# Patient Record
Sex: Male | Born: 1960 | Race: White | Hispanic: No | Marital: Married | Smoking: Former smoker
Health system: Southern US, Community
[De-identification: ages and names within clinical notes are randomized; demographics above are authoritative.]

## PROBLEM LIST (undated history)

## (undated) DIAGNOSIS — E785 Hyperlipidemia, unspecified: Secondary | ICD-10-CM

## (undated) DIAGNOSIS — I2510 Atherosclerotic heart disease of native coronary artery without angina pectoris: Secondary | ICD-10-CM

## (undated) DIAGNOSIS — I209 Angina pectoris, unspecified: Secondary | ICD-10-CM

## (undated) DIAGNOSIS — I509 Heart failure, unspecified: Secondary | ICD-10-CM

## (undated) DIAGNOSIS — I10 Essential (primary) hypertension: Secondary | ICD-10-CM

## (undated) DIAGNOSIS — I219 Acute myocardial infarction, unspecified: Secondary | ICD-10-CM

## (undated) DIAGNOSIS — R51 Headache: Secondary | ICD-10-CM

## (undated) HISTORY — PX: CORONARY ARTERY BYPASS GRAFT: SHX141

## (undated) HISTORY — PX: TOOTH EXTRACTION: SUR596

---

## 2010-06-16 ENCOUNTER — Inpatient Hospital Stay (HOSPITAL_COMMUNITY): Admission: EM | Admit: 2010-06-16 | Discharge: 2010-06-25 | Payer: Self-pay | Admitting: Emergency Medicine

## 2010-06-16 ENCOUNTER — Ambulatory Visit: Payer: Self-pay | Admitting: Internal Medicine

## 2010-06-17 ENCOUNTER — Ambulatory Visit: Payer: Self-pay | Admitting: Cardiothoracic Surgery

## 2010-06-18 ENCOUNTER — Encounter: Payer: Self-pay | Admitting: Internal Medicine

## 2010-06-20 ENCOUNTER — Encounter: Payer: Self-pay | Admitting: Cardiothoracic Surgery

## 2010-06-21 ENCOUNTER — Encounter: Payer: Self-pay | Admitting: Thoracic Surgery (Cardiothoracic Vascular Surgery)

## 2010-07-11 ENCOUNTER — Encounter
Admission: RE | Admit: 2010-07-11 | Discharge: 2010-07-11 | Payer: Self-pay | Admitting: Thoracic Surgery (Cardiothoracic Vascular Surgery)

## 2010-07-11 ENCOUNTER — Ambulatory Visit: Payer: Self-pay | Admitting: Thoracic Surgery (Cardiothoracic Vascular Surgery)

## 2010-07-25 ENCOUNTER — Encounter
Admission: RE | Admit: 2010-07-25 | Discharge: 2010-07-25 | Payer: Self-pay | Admitting: Thoracic Surgery (Cardiothoracic Vascular Surgery)

## 2010-07-25 ENCOUNTER — Ambulatory Visit: Payer: Self-pay | Admitting: Thoracic Surgery (Cardiothoracic Vascular Surgery)

## 2010-08-03 ENCOUNTER — Encounter: Admission: RE | Admit: 2010-08-03 | Discharge: 2010-08-03 | Payer: Self-pay | Admitting: Cardiothoracic Surgery

## 2010-08-03 ENCOUNTER — Ambulatory Visit: Payer: Self-pay | Admitting: Cardiothoracic Surgery

## 2010-09-05 ENCOUNTER — Encounter
Admission: RE | Admit: 2010-09-05 | Discharge: 2010-09-05 | Payer: Self-pay | Admitting: Thoracic Surgery (Cardiothoracic Vascular Surgery)

## 2010-09-05 ENCOUNTER — Ambulatory Visit: Payer: Self-pay | Admitting: Thoracic Surgery (Cardiothoracic Vascular Surgery)

## 2010-10-22 ENCOUNTER — Encounter: Payer: Self-pay | Admitting: Thoracic Surgery (Cardiothoracic Vascular Surgery)

## 2010-12-15 LAB — URINALYSIS, ROUTINE W REFLEX MICROSCOPIC
Hgb urine dipstick: NEGATIVE
Nitrite: NEGATIVE
Protein, ur: NEGATIVE mg/dL
Specific Gravity, Urine: 1.013 (ref 1.005–1.030)
Urobilinogen, UA: 1 mg/dL (ref 0.0–1.0)

## 2010-12-15 LAB — POCT I-STAT 3, ART BLOOD GAS (G3+)
Acid-Base Excess: 2 mmol/L (ref 0.0–2.0)
Acid-base deficit: 3 mmol/L — ABNORMAL HIGH (ref 0.0–2.0)
Acid-base deficit: 5 mmol/L — ABNORMAL HIGH (ref 0.0–2.0)
Bicarbonate: 21.7 mEq/L (ref 20.0–24.0)
Bicarbonate: 22.8 mEq/L (ref 20.0–24.0)
Bicarbonate: 23 mEq/L (ref 20.0–24.0)
O2 Saturation: 100 %
O2 Saturation: 91 %
O2 Saturation: 99 %
Patient temperature: 34.4
TCO2: 23 mmol/L (ref 0–100)
TCO2: 25 mmol/L (ref 0–100)
TCO2: 29 mmol/L (ref 0–100)
pCO2 arterial: 43.5 mmHg (ref 35.0–45.0)
pCO2 arterial: 45.3 mmHg — ABNORMAL HIGH (ref 35.0–45.0)
pCO2 arterial: 47.9 mmHg — ABNORMAL HIGH (ref 35.0–45.0)
pCO2 arterial: 49.5 mmHg — ABNORMAL HIGH (ref 35.0–45.0)
pH, Arterial: 7.274 — ABNORMAL LOW (ref 7.350–7.450)
pO2, Arterial: 291 mmHg — ABNORMAL HIGH (ref 80.0–100.0)
pO2, Arterial: 68 mmHg — ABNORMAL LOW (ref 80.0–100.0)

## 2010-12-15 LAB — POCT I-STAT, CHEM 8
BUN: 16 mg/dL (ref 6–23)
BUN: 9 mg/dL (ref 6–23)
Calcium, Ion: 1.09 mmol/L — ABNORMAL LOW (ref 1.12–1.32)
Chloride: 107 mEq/L (ref 96–112)
Chloride: 107 mEq/L (ref 96–112)
Creatinine, Ser: 0.7 mg/dL (ref 0.4–1.5)
Creatinine, Ser: 0.9 mg/dL (ref 0.4–1.5)
Glucose, Bld: 138 mg/dL — ABNORMAL HIGH (ref 70–99)
Glucose, Bld: 96 mg/dL (ref 70–99)
Potassium: 4.4 mEq/L (ref 3.5–5.1)
Sodium: 139 mEq/L (ref 135–145)
TCO2: 23 mmol/L (ref 0–100)

## 2010-12-15 LAB — POCT I-STAT 4, (NA,K, GLUC, HGB,HCT)
Glucose, Bld: 111 mg/dL — ABNORMAL HIGH (ref 70–99)
Glucose, Bld: 145 mg/dL — ABNORMAL HIGH (ref 70–99)
Glucose, Bld: 81 mg/dL (ref 70–99)
Glucose, Bld: 99 mg/dL (ref 70–99)
HCT: 28 % — ABNORMAL LOW (ref 39.0–52.0)
HCT: 33 % — ABNORMAL LOW (ref 39.0–52.0)
HCT: 33 % — ABNORMAL LOW (ref 39.0–52.0)
HCT: 40 % (ref 39.0–52.0)
Hemoglobin: 11.2 g/dL — ABNORMAL LOW (ref 13.0–17.0)
Hemoglobin: 11.2 g/dL — ABNORMAL LOW (ref 13.0–17.0)
Hemoglobin: 12.9 g/dL — ABNORMAL LOW (ref 13.0–17.0)
Hemoglobin: 9.5 g/dL — ABNORMAL LOW (ref 13.0–17.0)
Potassium: 3.5 mEq/L (ref 3.5–5.1)
Potassium: 4.2 mEq/L (ref 3.5–5.1)
Potassium: 5.7 mEq/L — ABNORMAL HIGH (ref 3.5–5.1)
Potassium: 5.9 mEq/L — ABNORMAL HIGH (ref 3.5–5.1)
Sodium: 131 mEq/L — ABNORMAL LOW (ref 135–145)
Sodium: 138 mEq/L (ref 135–145)
Sodium: 138 mEq/L (ref 135–145)
Sodium: 140 mEq/L (ref 135–145)

## 2010-12-15 LAB — CREATININE, SERUM
Creatinine, Ser: 0.84 mg/dL (ref 0.4–1.5)
Creatinine, Ser: 0.86 mg/dL (ref 0.4–1.5)
GFR calc Af Amer: >60 mL/min (ref >60)
GFR calc Af Amer: >60 mL/min (ref >60)

## 2010-12-15 LAB — CBC
HCT: 33.2 % — ABNORMAL LOW (ref 39.0–52.0)
HCT: 33.8 % — ABNORMAL LOW (ref 39.0–52.0)
HCT: 34.4 % — ABNORMAL LOW (ref 39.0–52.0)
HCT: 43.5 % (ref 39.0–52.0)
HCT: 43.7 % (ref 39.0–52.0)
Hemoglobin: 11.4 g/dL — ABNORMAL LOW (ref 13.0–17.0)
Hemoglobin: 11.7 g/dL — ABNORMAL LOW (ref 13.0–17.0)
Hemoglobin: 15.8 g/dL (ref 13.0–17.0)
Hemoglobin: 15.9 g/dL (ref 13.0–17.0)
MCH: 26.5 pg (ref 26.0–34.0)
MCH: 27 pg (ref 26.0–34.0)
MCH: 27.1 pg (ref 26.0–34.0)
MCH: 27.4 pg (ref 26.0–34.0)
MCH: 27.5 pg (ref 26.0–34.0)
MCH: 27.6 pg (ref 26.0–34.0)
MCH: 27.7 pg (ref 26.0–34.0)
MCH: 27.9 pg (ref 26.0–34.0)
MCH: 28 pg (ref 26.0–34.0)
MCH: 28.1 pg (ref 26.0–34.0)
MCHC: 33.4 g/dL (ref 30.0–36.0)
MCHC: 33.7 g/dL (ref 30.0–36.0)
MCHC: 34.6 g/dL (ref 30.0–36.0)
MCHC: 34.6 g/dL (ref 30.0–36.0)
MCHC: 34.7 g/dL (ref 30.0–36.0)
MCHC: 34.7 g/dL (ref 30.0–36.0)
MCHC: 35.4 g/dL (ref 30.0–36.0)
MCV: 79 fL (ref 78.0–100.0)
MCV: 79.3 fL (ref 78.0–100.0)
MCV: 79.8 fL (ref 78.0–100.0)
MCV: 80 fL (ref 78.0–100.0)
MCV: 82.4 fL (ref 78.0–100.0)
Platelets: 125 10*3/uL — ABNORMAL LOW (ref 150–400)
Platelets: 128 10*3/uL — ABNORMAL LOW (ref 150–400)
Platelets: 185 10*3/uL (ref 150–400)
Platelets: 196 10*3/uL (ref 150–400)
Platelets: 208 10*3/uL (ref 150–400)
RBC: 3.98 MIL/uL — ABNORMAL LOW (ref 4.22–5.81)
RBC: 4.14 MIL/uL — ABNORMAL LOW (ref 4.22–5.81)
RBC: 4.31 MIL/uL (ref 4.22–5.81)
RBC: 5.73 MIL/uL (ref 4.22–5.81)
RDW: 13.5 % (ref 11.5–15.5)
RDW: 13.6 % (ref 11.5–15.5)
RDW: 13.6 % (ref 11.5–15.5)
RDW: 13.6 % (ref 11.5–15.5)
RDW: 13.7 % (ref 11.5–15.5)
RDW: 13.7 % (ref 11.5–15.5)
WBC: 13.2 10*3/uL — ABNORMAL HIGH (ref 4.0–10.5)
WBC: 8.3 10*3/uL (ref 4.0–10.5)

## 2010-12-15 LAB — BASIC METABOLIC PANEL
BUN: 10 mg/dL (ref 6–23)
BUN: 11 mg/dL (ref 6–23)
BUN: 11 mg/dL (ref 6–23)
CO2: 27 mEq/L (ref 19–32)
Calcium: 10.1 mg/dL (ref 8.4–10.5)
Calcium: 9.3 mg/dL (ref 8.4–10.5)
Chloride: 102 mEq/L (ref 96–112)
Chloride: 108 mEq/L (ref 96–112)
Creatinine, Ser: 0.78 mg/dL (ref 0.4–1.5)
Creatinine, Ser: 0.94 mg/dL (ref 0.4–1.5)
Creatinine, Ser: 1.11 mg/dL (ref 0.4–1.5)
GFR calc Af Amer: >60 mL/min (ref >60)
GFR calc Af Amer: >60 mL/min (ref >60)
GFR calc non Af Amer: >60 mL/min (ref >60)
GFR calc non Af Amer: >60 mL/min (ref >60)
GFR calc non Af Amer: >60 mL/min (ref >60)
Glucose, Bld: 92 mg/dL (ref 70–99)
Potassium: 4.3 mEq/L (ref 3.5–5.1)
Sodium: 137 mEq/L (ref 135–145)
Sodium: 138 mEq/L (ref 135–145)
Sodium: 141 mEq/L (ref 135–145)

## 2010-12-15 LAB — HEMOGLOBIN AND HEMATOCRIT, BLOOD
HCT: 32.6 % — ABNORMAL LOW (ref 39.0–52.0)
Hemoglobin: 11.3 g/dL — ABNORMAL LOW (ref 13.0–17.0)

## 2010-12-15 LAB — HEPARIN LEVEL (UNFRACTIONATED)
Heparin Unfractionated: 0.28 IU/mL — ABNORMAL LOW (ref 0.30–0.70)
Heparin Unfractionated: 0.47 IU/mL (ref 0.30–0.70)
Heparin Unfractionated: 0.64 IU/mL (ref 0.30–0.70)
Heparin Unfractionated: 0.64 IU/mL (ref 0.30–0.70)

## 2010-12-15 LAB — BLOOD GAS, ARTERIAL
Acid-Base Excess: 1.1 mmol/L (ref 0.0–2.0)
Bicarbonate: 25.5 mEq/L — ABNORMAL HIGH (ref 20.0–24.0)
O2 Saturation: 96.2 %
Patient temperature: 98.6
pO2, Arterial: 83.2 mmHg (ref 80.0–100.0)

## 2010-12-15 LAB — COMPREHENSIVE METABOLIC PANEL
ALT: 19 U/L (ref 0–53)
AST: 18 U/L (ref 0–37)
CO2: 28 mEq/L (ref 19–32)
Calcium: 9.7 mg/dL (ref 8.4–10.5)
Chloride: 102 mEq/L (ref 96–112)
GFR calc Af Amer: >60 mL/min (ref >60)
GFR calc non Af Amer: >60 mL/min (ref >60)
Sodium: 140 mEq/L (ref 135–145)

## 2010-12-15 LAB — APTT: aPTT: 29 seconds (ref 24–37)

## 2010-12-15 LAB — CARDIAC PANEL(CRET KIN+CKTOT+MB+TROPI)
CK, MB: 4.2 ng/mL — ABNORMAL HIGH (ref 0.3–4.0)
Total CK: 110 U/L (ref 7–232)
Total CK: 123 U/L (ref 7–232)
Troponin I: 0.07 ng/mL — ABNORMAL HIGH (ref 0.00–0.06)

## 2010-12-15 LAB — LIPID PANEL
Triglycerides: 78 mg/dL (ref <150)
VLDL: 16 mg/dL (ref 0–40)

## 2010-12-15 LAB — CK TOTAL AND CKMB (NOT AT ARMC)
CK, MB: 3.8 ng/mL (ref 0.3–4.0)
Total CK: 119 U/L (ref 7–232)

## 2010-12-15 LAB — TYPE AND SCREEN: Antibody Screen: NEGATIVE

## 2010-12-15 LAB — GLUCOSE, CAPILLARY
Glucose-Capillary: 102 mg/dL — ABNORMAL HIGH (ref 70–99)
Glucose-Capillary: 110 mg/dL — ABNORMAL HIGH (ref 70–99)
Glucose-Capillary: 112 mg/dL — ABNORMAL HIGH (ref 70–99)
Glucose-Capillary: 114 mg/dL — ABNORMAL HIGH (ref 70–99)
Glucose-Capillary: 131 mg/dL — ABNORMAL HIGH (ref 70–99)
Glucose-Capillary: 159 mg/dL — ABNORMAL HIGH (ref 70–99)

## 2010-12-15 LAB — POCT CARDIAC MARKERS: Troponin i, poc: <0.05 ng/mL (ref 0.00–0.09)

## 2010-12-15 LAB — PROTIME-INR
INR: 0.95 (ref 0.00–1.49)
INR: 0.99 (ref 0.00–1.49)

## 2010-12-15 LAB — DIFFERENTIAL
Basophils Relative: 0 % (ref 0–1)
Eosinophils Absolute: 0.1 10*3/uL (ref 0.0–0.7)
Eosinophils Relative: 1 % (ref 0–5)
Monocytes Absolute: 0.6 10*3/uL (ref 0.1–1.0)
Monocytes Relative: 8 % (ref 3–12)
Neutrophils Relative %: 69 % (ref 43–77)

## 2010-12-15 LAB — MAGNESIUM: Magnesium: 2 mg/dL (ref 1.5–2.5)

## 2010-12-15 LAB — TROPONIN I: Troponin I: 0.05 ng/mL (ref 0.00–0.06)

## 2010-12-15 LAB — TSH: TSH: 2.05 u[IU]/mL (ref 0.350–4.500)

## 2011-02-14 NOTE — Assessment & Plan Note (Signed)
OFFICE VISIT   Charles Griffith, Charles Griffith  DOB:  14-Jan-1961                                        July 11, 2010  CHART #:  [id number]   HISTORY OF PRESENT ILLNESS:  The patient returns for routine follow up  status post coronary artery bypass grafting x4 on June 21, 2010.  His postoperative recovery has been uncomplicated.  Following hospital  discharge, the patient has continued to do very well.  He has not had  much pain in his chest and he has not been taking any sort of pain  relievers other than occasional Tylenol.  He reports that he is sleeping  well at night.  He has not had any shortness of breath unless he pushes  himself when he is walking or doing something physical.  His appetite is  normal.  Overall, he is getting along well and he reports no complaints.  Medications remain unchanged from the time of hospital discharge.   PHYSICAL EXAMINATION:  Notable for well-appearing male with blood  pressure 125/78, pulse 96 and regular, oxygen saturation 96% on room  air.  Examination of the chest is notable for median sternotomy incision  that is healing nicely.  The sternum is stable on palpation.  Auscultation reveals clear breath sounds that are symmetrical  bilaterally with exception of slightly diminished breath sounds at the  left lung base.  No wheezes or rhonchi are noted.  Cardiovascular exam  includes regular rate and rhythm.  No murmurs, rubs or gallops are  appreciated.  The abdomen is soft and nontender.  The extremities are  warm and well perfused.  There is no lower extremity edema.  The small  incision just above the right knee from endoscopic vein harvest is  healing nicely.  The remainder of his physical exam is unremarkable.   DIAGNOSTIC TESTS:  Chest x-ray performed today at the [facility] is reviewed.  This demonstrates clear lung fields bilaterally  with a small-to-moderate left pleural effusion and trivial right  pleural  effusion.  All the sternal wires appear intact.  The left-sided pleural  effusion has increased in comparison with the last x-ray performed on  hospital discharge.  No other abnormalities are noted.   IMPRESSION:  The patient seems to be doing quite well following recent  coronary artery bypass surgery.  He does have a small to moderate-sized  left pleural effusion.   PLAN:  I have encouraged the patient to continue to gradually increase  his physical activity as tolerated with his only limitations at this  point remaining that he refrain from heavy lifting or strenuous use of  his arms or shoulders for at least another 2 months.  I have given him a  prescription for a 2-week course of Lasix at a low dose (20 mg daily) to  help facilitate resolution of his left pleural effusion.  We will have  him return in 2 weeks' time for follow up chest x-ray to make sure that  the effusion is decreasing in size.  All of his questions have been  addressed.   Charles Griffith, M.D.  Electronically Signed   CHO/MEDQ  D:  07/11/2010  T:  07/12/2010  Job:  [id number]   cc:   Ricki Rodriguez, M.D.

## 2011-02-14 NOTE — Assessment & Plan Note (Signed)
OFFICE VISIT   Charles Griffith, Charles Griffith  DOB:  04-08-1961                                        August 03, 2010  CHART #:  [id number]   CURRENT PROBLEMS:  1. Postoperative left pleural effusion, mild.  2. Status post coronary artery bypass graft x4 with bilateral internal      mammary artery grafts on June 21, 2010, by Dr. Cornelius Moras.   PRESENT ILLNESS:  The patient is a 50 year old Caucasian male ex-smoker  who reports to the office with complaints of shortness of breath when he  first wakes up in the morning.  He sleeps on 1-2 pillows, and after he  sits up and walks around his breathing improves.  He denies weight gain,  salty intake, or ankle edema.  He denies angina, fever,     or problems  with his surgical incisions.  He was last seen in the office on July 25, 2010, after a course of Lasix 20 mg a day for 1 week which showed  improvement of a left pleural effusion.  He has had a dry cough but no  productive cough and he is a nonsmoker.   PHYSICAL EXAMINATION:  He is afebrile with blood pressure 130/85, pulse  80 and regular, respirations 18, saturation on room air 96%.  Breath  sounds are very clear and equal.  The sternum incision is healing  nicely, and his cardiac rhythm is regular without murmur or rub.  He has  no ankle edema and the leg vein harvest site is healing.   DIAGNOSTIC TESTS:  PA and lateral chest x-ray shows some atelectasis and  a small left-sided posterior and basilar effusion.  The sternal wires  were all intact.  There was no evidence of pneumonia or infiltrate.   IMPRESSION AND PLAN:  Mild fluid retention following bilateral internal  mammary artery grafting and multivessel bypass surgery.  He will be  given another 10-day course of Lasix 40 mg  a day with potassium 20 mEq a day.  He will return to the office for  follow up on August 29, 2010, with a chest x-ray.   Kerin Perna, M.D.  Electronically Signed   PV/MEDQ  D:   08/03/2010  T:  08/04/2010  Job:  [id number]   cc:   Ricki Rodriguez, M.D.

## 2011-02-14 NOTE — Assessment & Plan Note (Signed)
OFFICE VISIT   RAINEY, KAHRS  DOB:  1961/07/28                                        September 05, 2010  CHART #:  [id number]   HISTORY:  The patient returns for further followup status post coronary  artery bypass grafting on June 21, 2010.  He was last seen here in  the office on August 03, 2010, by Dr. Donata Clay.  Since then, he has  continued to do well.  He has not had any significant pain at all in his  chest and he is not using any sort of pain relievers.  His exercise  tolerance is quite good and he is now reporting that he is making  himself go for a walk at least 30 minutes every day.  He has not had any  shortness of breath.  His appetite is good.  He is sleeping well at  night.  He has no other complaints.   PHYSICAL EXAMINATION:  Notable for well-appearing male with blood  pressure 120/82, pulse 85, and oxygen saturation 97% on room air.  Examination of the chest is notable for median sternotomy incision that  is healing nicely.  The sternum is stable on palpation.  Breath sounds  are clear to auscultation and symmetrical bilaterally.  No wheezes,  rales, or rhonchi are noted.  Cardiovascular exam is notable for regular  rate and rhythm.  No murmurs, rubs, or gallops are noted.  The abdomen  is soft and nontender.  The extremities are warm and well perfused.  There is no lower extremity edema.  The small incision from endoscopic  vein harvest from his right thigh and leg has healed nicely.   DIAGNOSTIC TESTS:  Chest x-ray performed today at the [facility] is reviewed.  This demonstrates further decrease in the left  pleural effusion which now is mostly gone.  No other abnormalities are  noted.   IMPRESSION:  Satisfactory progress following coronary artery bypass  grafting.   PLAN:  I have encouraged the patient to continue to increase his  physical activity with his only limitation at this point remaining that  he  refrain from heavy lifting or strenuous use of his arms and shoulders  for at least another 6 weeks.  I have encouraged him to get involved in  the cardiac  rehab program.  All of his questions have been addressed.  In the  future, he will call and return to see [place] as needed.   Salvatore Decent. Cornelius Moras, M.D.  Electronically Signed   CHO/MEDQ  D:  09/05/2010  T:  09/06/2010  Job:  [id number]   cc:   Ricki Rodriguez, M.D.

## 2011-02-14 NOTE — Assessment & Plan Note (Signed)
OFFICE VISIT   ROD, MAJERUS  DOB:  September 17, 1961                                        July 25, 2010  CHART #:  [id number]   REASON FOR FOLLOWUP:  Followup, left pleural effusion.   HISTORY OF PRESENT ILLNESS:  This is a 50 year old Caucasian male who  was last seen in the office on July 11, 2010, for routine followup  status post CABG by Dr. Cornelius Moras on September 2011.  The patient's only  complaint at that time was some shortness of breath when he pushed  himself while he was walking or doing physical activity.  Chest x-ray  that was done then showed a  small-to-moderate left pleural effusion.  The patient was placed on a 2-  week course of Lasix 20 mg p.o. daily in the hopes of resolving the left  pleural effusion.  The patient states that he just finished the Lasix  today.  He denies any shortness of breath and feels that his breathing  overall is improving.  He denies any fever, chills, night sweats or  chest pain.   PHYSICAL EXAMINATION:  General:  This is a pleasant 50 year old  Caucasian male who is in no acute distress who is alert and cooperative.  His vital signs are as follows:  BP 132/85, heart rate 96, respiration  rate 16, O2 sat 96% on room air.  Cardiovascular:  Regular rate and  rhythm.  No murmurs, gallops or rubs.  Pulmonary:  Slightly decreased at  the bases.  No rales, wheezes or rhonchi.  Abdomen:  Soft, nontender.  Bowel sounds present.  Sternal right lower extremity wound is clean,  dry, well healed.  No sign of infection.  Chest x-ray done today showed  probable slight improvement of the left pleural effusion.  Bibasilar  atelectasis, stable cardiomegaly and small right pleural effusion.   IMPRESSION AND PLAN:  The patient appears to have some improvement of  his left pleural effusion.  He is not symptomatic and has no complaints  of shortness of breath at this time.  Dr. Cornelius Moras evaluated the patient and  reviewed his chest  x-ray.  We will not resume Lasix at this time.  The  patient is going to be seen in the office in followup in 8 weeks with a  followup chest x-ray.  The patient is instructed if he develops any shortness of breath, fever  or chills, he is to contact our office immediately.   Doree Fudge, PA   DZ/MEDQ  D:  07/25/2010  T:  07/26/2010  Job:  [id number]   cc:   Ricki Rodriguez, M.D.  Salvatore Decent. Cornelius Moras, M.D.

## 2011-04-17 DIAGNOSIS — Z0271 Encounter for disability determination: Secondary | ICD-10-CM

## 2011-06-19 IMAGING — CR DG CHEST 2V
2 series · 2 of 2 positions shown · non-contrast
Comparison: [HOSPITAL] chest x-rays 06/16/2010 through
06/23/2010.

CLINICAL DATA: 3 weeks post CABG, cough, shortness breath, former
smoker. Previous very small biapical pneumothoraces.

CHEST - 2 VIEW

[view not recorded (1 of 2)]
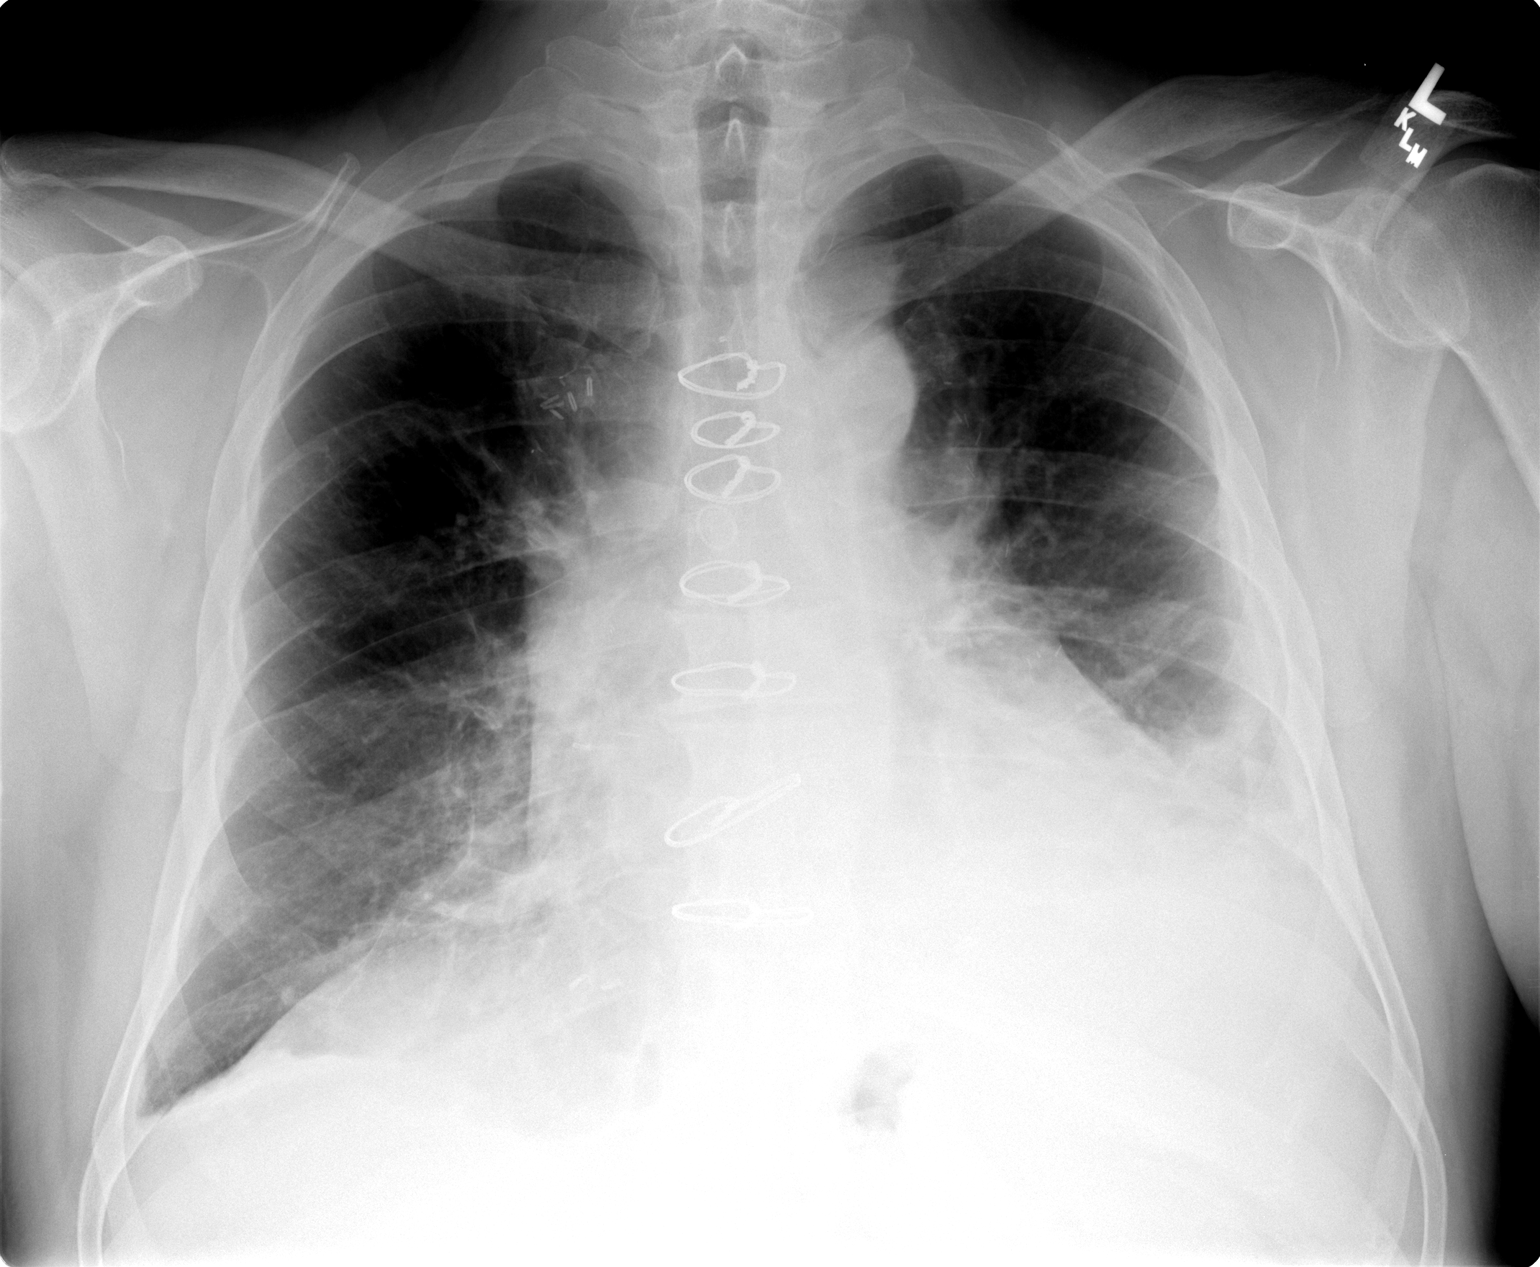

[view not recorded (2 of 2)]
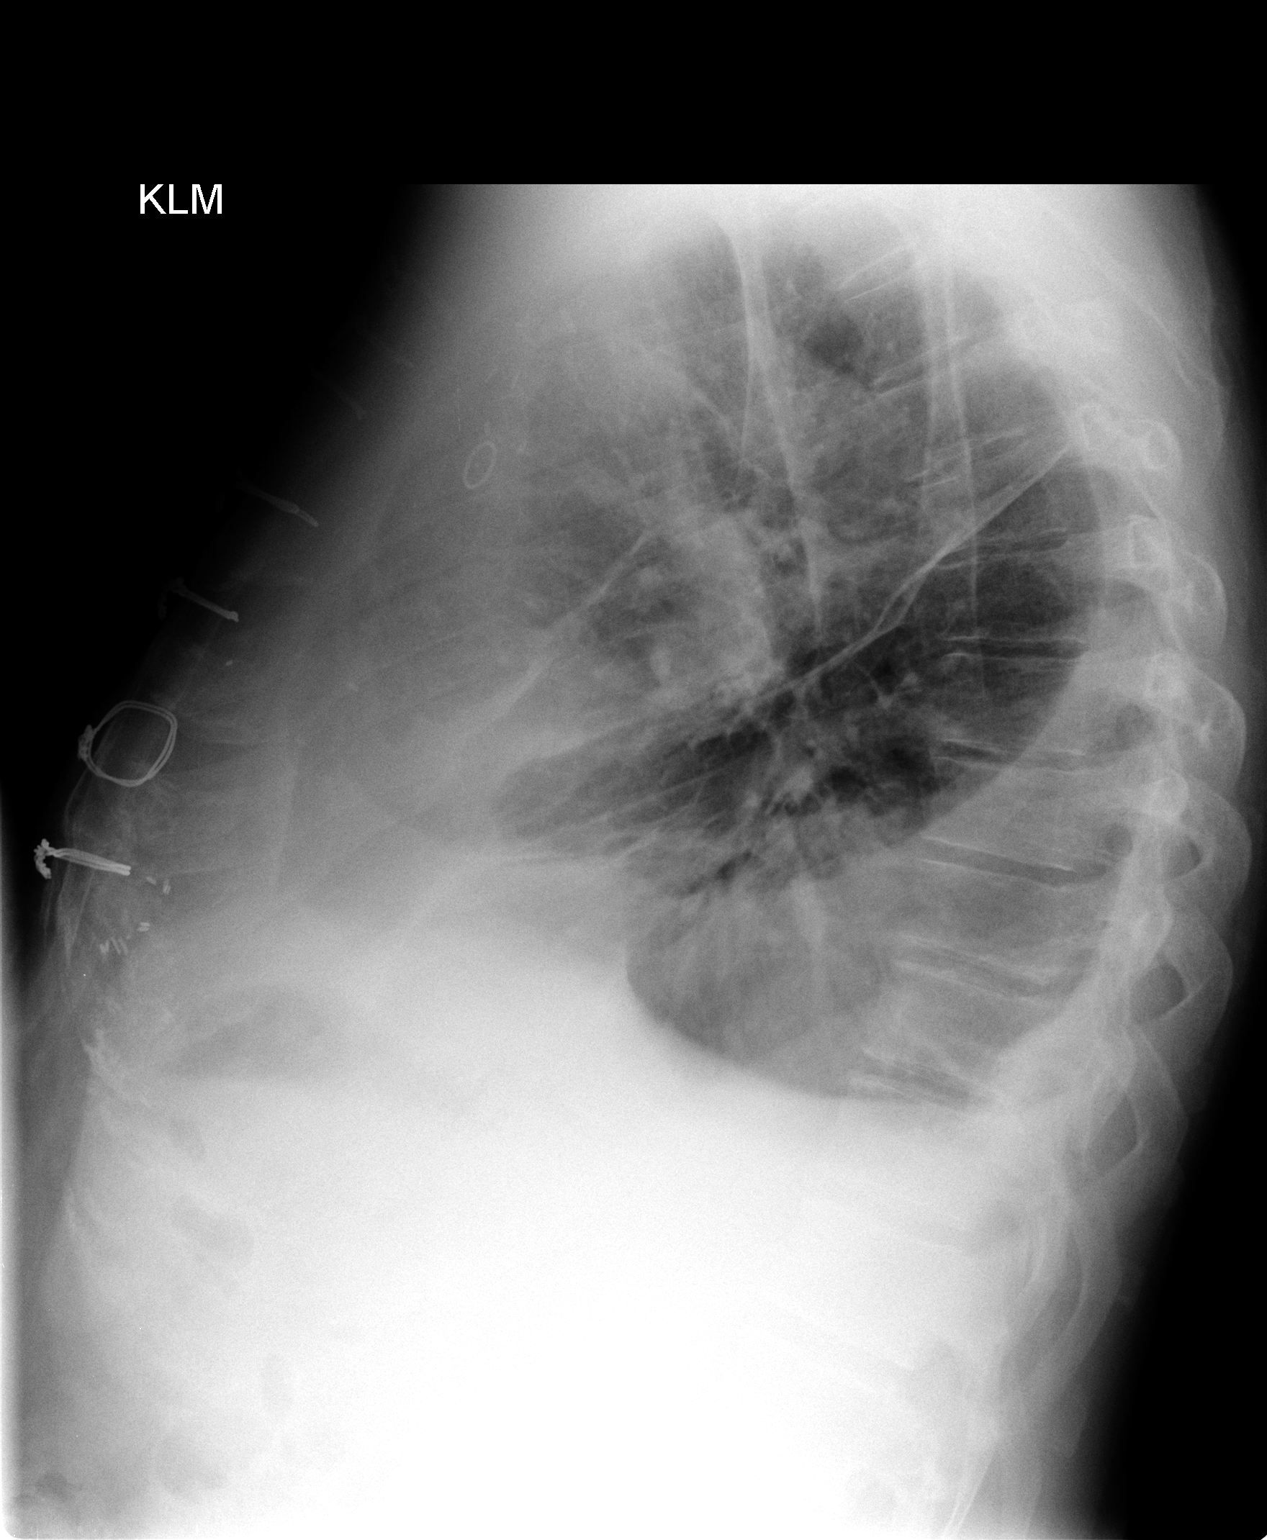

[2 of 2 positions shown; findings below may reference images not displayed]

FINDINGS: Resolution previous small biapical pneumothoraces seen
since 06/23/2010.  Progressive small left pleural effusion and left
basilar atelectasis seen with stable tiny right pleural effusion
with greater aeration right lung base.  Upper lungs are clear.
Stable slight cardiomegaly post CABG visualized.  No change in
slight anterior wedging mid dorsal vertebrae.
IMPRESSION: 1.  Resolution previous tiny biapical pneumothoraces since
06/23/2010.
[DATE].  Progressive small left pleural effusion and left basilar
atelectasis.
3.  Stable tiny right pleural effusion with lesser right basilar
atelectasis/greater aeration.
4.  Stable slight cardiomegaly post CABG.
5.  No additional acute findings.

## 2011-08-17 ENCOUNTER — Encounter: Payer: Self-pay | Admitting: Emergency Medicine

## 2011-08-17 ENCOUNTER — Other Ambulatory Visit: Payer: Self-pay

## 2011-08-17 ENCOUNTER — Inpatient Hospital Stay (HOSPITAL_COMMUNITY)
Admission: EM | Admit: 2011-08-17 | Discharge: 2011-08-19 | DRG: 312 | Disposition: A | Discharge status: Home / Self Care | Payer: Self-pay | Attending: Cardiovascular Disease | Admitting: Cardiovascular Disease

## 2011-08-17 ENCOUNTER — Emergency Department (HOSPITAL_COMMUNITY): Payer: Self-pay

## 2011-08-17 DIAGNOSIS — I252 Old myocardial infarction: Secondary | ICD-10-CM

## 2011-08-17 DIAGNOSIS — I2510 Atherosclerotic heart disease of native coronary artery without angina pectoris: Secondary | ICD-10-CM | POA: Diagnosis present

## 2011-08-17 DIAGNOSIS — I10 Essential (primary) hypertension: Secondary | ICD-10-CM | POA: Diagnosis present

## 2011-08-17 DIAGNOSIS — E669 Obesity, unspecified: Secondary | ICD-10-CM | POA: Diagnosis present

## 2011-08-17 DIAGNOSIS — I509 Heart failure, unspecified: Secondary | ICD-10-CM | POA: Diagnosis present

## 2011-08-17 DIAGNOSIS — R55 Syncope and collapse: Principal | ICD-10-CM | POA: Diagnosis present

## 2011-08-17 DIAGNOSIS — E785 Hyperlipidemia, unspecified: Secondary | ICD-10-CM | POA: Diagnosis present

## 2011-08-17 DIAGNOSIS — R51 Headache: Secondary | ICD-10-CM | POA: Diagnosis present

## 2011-08-17 DIAGNOSIS — Z951 Presence of aortocoronary bypass graft: Secondary | ICD-10-CM

## 2011-08-17 HISTORY — DX: Angina pectoris, unspecified: I20.9

## 2011-08-17 HISTORY — DX: Hyperlipidemia, unspecified: E78.5

## 2011-08-17 HISTORY — DX: Headache: R51

## 2011-08-17 HISTORY — DX: Heart failure, unspecified: I50.9

## 2011-08-17 HISTORY — DX: Acute myocardial infarction, unspecified: I21.9

## 2011-08-17 HISTORY — DX: Atherosclerotic heart disease of native coronary artery without angina pectoris: I25.10

## 2011-08-17 HISTORY — DX: Essential (primary) hypertension: I10

## 2011-08-17 LAB — CBC
HCT: 41.9 % (ref 39.0–52.0)
MCH: 27 pg (ref 26.0–34.0)
MCHC: 33.1 g/dL (ref 30.0–36.0)
MCV: 79.7 fL (ref 78.0–100.0)
Platelets: 204 10*3/uL (ref 150–400)
RDW: 13.7 % (ref 11.5–15.5)
WBC: 11.6 10*3/uL — ABNORMAL HIGH (ref 4.0–10.5)

## 2011-08-17 LAB — COMPREHENSIVE METABOLIC PANEL
Albumin: 3.4 g/dL — ABNORMAL LOW (ref 3.5–5.2)
Alkaline Phosphatase: 49 U/L (ref 39–117)
BUN: 16 mg/dL (ref 6–23)
CO2: 24 mEq/L (ref 19–32)
Chloride: 103 mEq/L (ref 96–112)
Creatinine, Ser: 0.8 mg/dL (ref 0.50–1.35)
GFR calc non Af Amer: >90 mL/min (ref >90)
Potassium: 3.8 mEq/L (ref 3.5–5.1)
Total Bilirubin: 0.5 mg/dL (ref 0.3–1.2)

## 2011-08-17 LAB — POCT I-STAT, CHEM 8
Glucose, Bld: 125 mg/dL — ABNORMAL HIGH (ref 70–99)
HCT: 45 % (ref 39.0–52.0)
Hemoglobin: 15.3 g/dL (ref 13.0–17.0)
Potassium: 4 mEq/L (ref 3.5–5.1)
Sodium: 141 mEq/L (ref 135–145)

## 2011-08-17 LAB — POCT I-STAT TROPONIN I

## 2011-08-17 LAB — MAGNESIUM: Magnesium: 2 mg/dL (ref 1.5–2.5)

## 2011-08-17 LAB — CARDIAC PANEL(CRET KIN+CKTOT+MB+TROPI)
CK, MB: 7 ng/mL (ref 0.3–4.0)
Relative Index: 2.5 (ref 0.0–2.5)
Troponin I: <0.3 ng/mL (ref <0.30)

## 2011-08-17 LAB — PROTIME-INR: INR: 1.03 (ref 0.00–1.49)

## 2011-08-17 MED ORDER — ONDANSETRON HCL 4 MG/2ML IJ SOLN: 4.0000 mg | Freq: Four times a day (QID) | INTRAMUSCULAR | Status: DC | PRN

## 2011-08-17 MED ORDER — ENOXAPARIN SODIUM 40 MG/0.4ML ~~LOC~~ SOLN
40.0000 mg | SUBCUTANEOUS | Status: DC
  Administered 2011-08-18 – 2011-08-19 (×2): 40 mg via SUBCUTANEOUS
  Filled 2011-08-17 (×3): qty 0.4

## 2011-08-17 MED ORDER — ASPIRIN 300 MG RE SUPP: 300.0000 mg | RECTAL | Status: DC

## 2011-08-17 MED ORDER — ACETAMINOPHEN 325 MG PO TABS
650.0000 mg | ORAL_TABLET | ORAL | Status: DC | PRN
  Administered 2011-08-19: 650 mg via ORAL
  Filled 2011-08-17: qty 2

## 2011-08-17 MED ORDER — METOPROLOL TARTRATE 12.5 MG HALF TABLET
12.5000 mg | ORAL_TABLET | Freq: Two times a day (BID) | ORAL | Status: DC
  Administered 2011-08-18 (×2): 12.5 mg via ORAL
  Filled 2011-08-17 (×4): qty 1

## 2011-08-17 MED ORDER — SODIUM CHLORIDE 0.9 % IJ SOLN: 3.0000 mL | Freq: Two times a day (BID) | INTRAMUSCULAR | Status: DC

## 2011-08-17 MED ORDER — CLOPIDOGREL BISULFATE 75 MG PO TABS
75.0000 mg | ORAL_TABLET | Freq: Every day | ORAL | Status: DC
  Administered 2011-08-18 – 2011-08-19 (×2): 75 mg via ORAL
  Filled 2011-08-17 (×2): qty 1

## 2011-08-17 MED ORDER — ALPRAZOLAM 0.25 MG PO TABS: 0.2500 mg | ORAL_TABLET | Freq: Two times a day (BID) | ORAL | Status: DC | PRN

## 2011-08-17 MED ORDER — ASPIRIN EC 81 MG PO TBEC
81.0000 mg | DELAYED_RELEASE_TABLET | Freq: Every day | ORAL | Status: DC
  Administered 2011-08-18 – 2011-08-19 (×2): 81 mg via ORAL
  Filled 2011-08-17 (×3): qty 1

## 2011-08-17 MED ORDER — NITROGLYCERIN 0.4 MG SL SUBL: 0.4000 mg | SUBLINGUAL_TABLET | SUBLINGUAL | Status: DC | PRN

## 2011-08-17 MED ORDER — SIMVASTATIN 20 MG PO TABS
20.0000 mg | ORAL_TABLET | Freq: Every day | ORAL | Status: DC
  Administered 2011-08-18: 20 mg via ORAL
  Filled 2011-08-17 (×3): qty 1

## 2011-08-17 MED ORDER — SODIUM CHLORIDE 0.9 % IJ SOLN: 3.0000 mL | INTRAMUSCULAR | Status: DC | PRN

## 2011-08-17 MED ORDER — ZOLPIDEM TARTRATE 5 MG PO TABS: 10.0000 mg | ORAL_TABLET | Freq: Every evening | ORAL | Status: DC | PRN

## 2011-08-17 NOTE — ED Notes (Signed)
Attempted to call report. States will call back.  

## 2011-08-17 NOTE — ED Provider Notes (Signed)
History     CSN: [id number] Arrival date & time: 08/17/2011  7:26 PM   First MD Initiated Contact with Patient 08/17/11 1927      Chief Complaint  Patient presents with  . Loss of Consciousness   History is obtained from patient and from patient's wife. (Consider location/radiation/quality/duration/timing/severity/associated sxs/prior treatment) HPI Patient had 2 syncopal events today a few minutes apart at approximately 5:30 PM. Immediately before the syncopal event he complained of low back pain for few minutes became pale and sweaty. He is presently asymptomatic denies any chest pain back pain or abdominal pain no other complaint. Past Medical History  Diagnosis Date  . Hypertension   . Hyperlipemia    CAD Past Surgical History  Procedure Date  . Coronary artery bypass graft     History reviewed. No pertinent family history.  History  Substance Use Topics  . Smoking status: Never Smoker   . Smokeless tobacco: Not on file  . Alcohol Use: No      Review of Systems  Constitutional: Negative.   HENT: Negative.   Respiratory: Negative.   Cardiovascular:       Syncope  Gastrointestinal: Negative.   Musculoskeletal: Positive for back pain.  Skin: Negative.   Neurological: Negative.   Hematological: Negative.   Psychiatric/Behavioral: Negative.     Allergies  Review of patient's allergies indicates no known allergies.  Home Medications  No current outpatient prescriptions on file.  BP 123/71  Pulse 83  Temp(Src) 97.8 F (36.6 C) (Oral)  Resp 19  SpO2 100%  Physical Exam  Nursing note and vitals reviewed. Constitutional: He appears well-developed and well-nourished.  HENT:  Head: Normocephalic and atraumatic.  Eyes: Conjunctivae are normal. Pupils are equal, round, and reactive to light.  Neck: Neck supple. No tracheal deviation present. No thyromegaly present.  Cardiovascular: Normal rate and regular rhythm.   No murmur heard. Pulmonary/Chest:  Effort normal and breath sounds normal.  Abdominal: Soft. Bowel sounds are normal. He exhibits no distension. There is no tenderness.  Musculoskeletal: Normal range of motion. He exhibits no edema and no tenderness.  Neurological: He is alert. Coordination normal.  Skin: Skin is warm and dry. No rash noted.  Psychiatric: He has a normal mood and affect.    ED Course  Procedures (including critical care time)  Date: 08/17/2011  Rate: 65  Rhythm: normal sinus rhythm  QRS Axis: normal  Intervals: normal  ST/T Wave abnormalities: nonspecific ST/T changes  Conduction Disutrbances:nonspecific intraventricular conduction delay  Narrative Interpretation:   Old EKG Reviewed: changes noted Tracing from 06/22/2010 showed left axis deviation, otherwise no significant change   Labs Reviewed  I-STAT TROPONIN I  CBC  I-STAT, CHEM 8   No results found.   No diagnosis found.  9 PM remains comfortable asymptomatic Glasgow Coma Score 15 Results for orders placed during the hospital encounter of 08/17/11  CBC      Component Value Range   WBC 14.4 (*) 4.0 - 10.5 (K/uL)   RBC 5.45  4.22 - 5.81 (MIL/uL)   Hemoglobin 14.5  13.0 - 17.0 (g/dL)   HCT 40.9  81.1 - 91.4 (%)   MCV 80.4  78.0 - 100.0 (fL)   MCH 26.6  26.0 - 34.0 (pg)   MCHC 33.1  30.0 - 36.0 (g/dL)   RDW 78.2  95.6 - 21.3 (%)   Platelets 204  150 - 400 (K/uL)   No results found.  MDM  Spoke with Dr. Algie Coffer plan admit. Cardiac monitor  Diagnosis syncope        Doug Sou, MD 08/17/11 2106

## 2011-08-17 NOTE — ED Notes (Signed)
Pt given 325 mg ASA. BS 115. Pt denies pain.

## 2011-08-17 NOTE — H&P (Signed)
Charles Griffith is an 50 y.o. male.   Chief Complaint: Syncopy HPI: 50 years old white male with  Past Medical History  Diagnosis Date  . Hypertension   . Hyperlipemia       Past Surgical History  Procedure Date  . Coronary artery bypass graft     History reviewed. No pertinent family history. Social History:  reports that he has never smoked. He does not have any smokeless tobacco history on file. He reports that he does not drink alcohol or use illicit drugs.  Allergies: No Known Allergies  Medications Prior to Admission  Medication Dose Route Frequency Provider Last Rate Last Dose  . acetaminophen (TYLENOL) tablet 650 mg  650 mg Oral Q4H PRN Ricki Rodriguez, MD      . ALPRAZolam Prudy Feeler) tablet 0.25 mg  0.25 mg Oral BID PRN Ricki Rodriguez, MD      . aspirin EC tablet 81 mg  81 mg Oral Daily Ricki Rodriguez, MD      . clopidogrel (PLAVIX) tablet 75 mg  75 mg Oral Q breakfast Ricki Rodriguez, MD      . enoxaparin (LOVENOX) injection 40 mg  40 mg Subcutaneous Q24H Ricki Rodriguez, MD      . metoprolol tartrate (LOPRESSOR) tablet 12.5 mg  12.5 mg Oral BID Ricki Rodriguez, MD      . nitroGLYCERIN (NITROSTAT) SL tablet 0.4 mg  0.4 mg Sublingual Q5 min PRN Ricki Rodriguez, MD      . ondansetron (ZOFRAN) injection 4 mg  4 mg Intravenous Q6H PRN Ricki Rodriguez, MD      . simvastatin (ZOCOR) tablet 20 mg  20 mg Oral q1800 Ricki Rodriguez, MD      . zolpidem (AMBIEN) tablet 10 mg  10 mg Oral QHS PRN Ricki Rodriguez, MD      . DISCONTD: aspirin suppository 300 mg  300 mg Rectal NOW Ricki Rodriguez, MD      . DISCONTD: sodium chloride 0.9 % injection 3 mL  3 mL Intravenous Q12H Ricki Rodriguez, MD      . DISCONTD: sodium chloride 0.9 % injection 3 mL  3 mL Intravenous PRN Ricki Rodriguez, MD       No current outpatient prescriptions on file as of 08/17/2011.    Results for orders placed during the hospital encounter of 08/17/11 (from the past 48 hour(s))  CBC     Status: Abnormal   Collection Time     08/17/11  8:16 PM      Component Value Range Comment   WBC 14.4 (*) 4.0 - 10.5 (K/uL)    RBC 5.45  4.22 - 5.81 (MIL/uL)    Hemoglobin 14.5  13.0 - 17.0 (g/dL)    HCT 45.4  09.8 - 11.9 (%)    MCV 80.4  78.0 - 100.0 (fL)    MCH 26.6  26.0 - 34.0 (pg)    MCHC 33.1  30.0 - 36.0 (g/dL)    RDW 14.7  82.9 - 56.2 (%)    Platelets 204  150 - 400 (K/uL)   POCT I-STAT TROPONIN I     Status: Normal   Collection Time   08/17/11  9:17 PM      Component Value Range Comment   Troponin i, poc 0.01  0.00 - 0.08 (ng/mL)    Comment 3            POCT I-STAT, CHEM 8  Status: Abnormal   Collection Time   08/17/11  9:20 PM      Component Value Range Comment   Sodium 141  135 - 145 (mEq/L)    Potassium 4.0  3.5 - 5.1 (mEq/L)    Chloride 103  96 - 112 (mEq/L)    BUN 18  6 - 23 (mg/dL)    Creatinine, Ser 1.19  0.50 - 1.35 (mg/dL)    Glucose, Bld 147 (*) 70 - 99 (mg/dL)    Calcium, Ion 8.29  1.12 - 1.32 (mmol/L)    TCO2 26  0 - 100 (mmol/L)    Hemoglobin 15.3  13.0 - 17.0 (g/dL)    HCT 56.2  13.0 - 86.5 (%)    Dg Chest 2 View  08/17/2011  *RADIOLOGY REPORT*  Clinical Data: Syncope  CHEST - 2 VIEW  Comparison: Most recent 09/05/2010  Findings: Normal heart size.  Previous CABG. Bilateral costophrenic angle blunting is likely chronic, related to prior surgery.  COPD. No active infiltrates.  No evidence for heart failure.  Normal osseous structures.  IMPRESSION: Postsurgical changes as described.  No definite active infiltrates.  Original Report Authenticated By: Elsie Stain, M.D.    @ROS @  Blood pressure 121/71, pulse 77, temperature 97.8 F (36.6 C), temperature source Oral, resp. rate 23, SpO2 99.00%. General appearance: alert, cooperative and appears stated age Head: Normocephalic, without obvious abnormality, atraumatic Eyes: conjunctivae/corneas clear. PERRL, EOM's intact. Fundi benign. Ears: normal TM's and external ear canals both ears Nose: Nares normal. Septum midline. Mucosa normal.  No drainage or sinus tenderness. Throat: lips, mucosa, and tongue normal; teeth and gums normal Neck: no adenopathy, no carotid bruit, no JVD and supple, symmetrical, trachea midline Resp: clear to auscultation bilaterally Cardio: regular rate and rhythm, S1, S2 normal, no murmur, click, rub or gallop GI: soft, non-tender; bowel sounds normal; no masses,  no organomegaly Extremities: extremities normal, atraumatic, no cyanosis or edema Skin: Skin color, texture, turgor normal. No rashes or lesions Neurologic: Alert and oriented X 3, normal strength and tone. Normal symmetric reflexes. Normal coordination and gait  Assessment 1. Syncopy 2. CABG 3. CAD  Plan:  Admit. Monitor Home medications.  Bana Borgmeyer S 08/17/2011, 10:03 PM

## 2011-08-17 NOTE — ED Notes (Signed)
Pt states was sitting watching tv. Pt states became hot and flushed. Pt walked to kitchen to get a drink of water and felt dizzy. Pt sat down in chair. Wife states pt passed out. Pt denies pain at this time. Pt denies dizziness. No distress noted.

## 2011-08-18 ENCOUNTER — Inpatient Hospital Stay (HOSPITAL_COMMUNITY): Payer: Self-pay

## 2011-08-18 ENCOUNTER — Other Ambulatory Visit: Payer: Self-pay

## 2011-08-18 ENCOUNTER — Encounter (HOSPITAL_COMMUNITY): Payer: Self-pay | Admitting: *Deleted

## 2011-08-18 LAB — CBC
HCT: 42 % (ref 39.0–52.0)
MCHC: 32.6 g/dL (ref 30.0–36.0)
MCV: 80.6 fL (ref 78.0–100.0)
Platelets: 205 10*3/uL (ref 150–400)
RDW: 13.7 % (ref 11.5–15.5)

## 2011-08-18 LAB — CARDIAC PANEL(CRET KIN+CKTOT+MB+TROPI)
CK, MB: 5.6 ng/mL — ABNORMAL HIGH (ref 0.3–4.0)
Total CK: 217 U/L (ref 7–232)
Troponin I: <0.3 ng/mL (ref <0.30)

## 2011-08-18 LAB — LIPID PANEL
Cholesterol: 132 mg/dL (ref 0–200)
Total CHOL/HDL Ratio: 3.1 RATIO

## 2011-08-18 LAB — BASIC METABOLIC PANEL
BUN: 12 mg/dL (ref 6–23)
Creatinine, Ser: 0.85 mg/dL (ref 0.50–1.35)
GFR calc Af Amer: >90 mL/min (ref >90)
GFR calc non Af Amer: >90 mL/min (ref >90)

## 2011-08-18 MED ORDER — TECHNETIUM TC 99M TETROFOSMIN IV KIT
10.0000 | PACK | Freq: Once | INTRAVENOUS | Status: AC | PRN
  Administered 2011-08-18: 10 via INTRAVENOUS

## 2011-08-18 MED ORDER — TECHNETIUM TC 99M TETROFOSMIN IV KIT
30.0000 | PACK | Freq: Once | INTRAVENOUS | Status: AC | PRN
  Administered 2011-08-18: 30 via INTRAVENOUS

## 2011-08-18 MED ORDER — REGADENOSON 0.4 MG/5ML IV SOLN
0.4000 mg | Freq: Once | INTRAVENOUS | Status: AC
  Administered 2011-08-18: 0.4 mg via INTRAVENOUS

## 2011-08-18 NOTE — Progress Notes (Signed)
Charles Griffith is an 50 y.o. male.   Subjective: No chest pain. No dizziness.  Past Medical History  Diagnosis Date  . Hypertension   . Hyperlipemia   . Angina   . Coronary artery disease   . CHF (congestive heart failure)   . Headache occasional  . Myocardial infarction       Past Surgical History  Procedure Date  . Coronary artery bypass graft     Family History  Problem Relation Age of Onset  . Anuerysm Father    Social History:  reports that he quit smoking about 7 years ago. His smoking use included Cigarettes. He has a 20 pack-year smoking history. He does not have any smokeless tobacco history on file. He reports that he does not drink alcohol or use illicit drugs.  Allergies: No Known Allergies  Medications Prior to Admission  Medication Dose Route Frequency Provider Last Rate Last Dose  . acetaminophen (TYLENOL) tablet 650 mg  650 mg Oral Q4H PRN Ricki Rodriguez, MD      . ALPRAZolam Prudy Feeler) tablet 0.25 mg  0.25 mg Oral BID PRN Ricki Rodriguez, MD      . aspirin EC tablet 81 mg  81 mg Oral Daily Ricki Rodriguez, MD      . clopidogrel (PLAVIX) tablet 75 mg  75 mg Oral Q breakfast Ricki Rodriguez, MD      . enoxaparin (LOVENOX) injection 40 mg  40 mg Subcutaneous Q24H Ricki Rodriguez, MD      . metoprolol tartrate (LOPRESSOR) tablet 12.5 mg  12.5 mg Oral BID Ricki Rodriguez, MD      . nitroGLYCERIN (NITROSTAT) SL tablet 0.4 mg  0.4 mg Sublingual Q5 min PRN Ricki Rodriguez, MD      . ondansetron (ZOFRAN) injection 4 mg  4 mg Intravenous Q6H PRN Ricki Rodriguez, MD      . simvastatin (ZOCOR) tablet 20 mg  20 mg Oral q1800 Ricki Rodriguez, MD      . technetium tetrofosmin (TC-MYOVIEW) injection 10 milli Curie  10 milli Curie Intravenous Once PRN Medication Radiologist   10 milli Curie at 08/18/11 0730  . zolpidem (AMBIEN) tablet 10 mg  10 mg Oral QHS PRN Ricki Rodriguez, MD      . DISCONTD: aspirin suppository 300 mg  300 mg Rectal NOW Ricki Rodriguez, MD      . DISCONTD: sodium  chloride 0.9 % injection 3 mL  3 mL Intravenous Q12H Ricki Rodriguez, MD      . DISCONTD: sodium chloride 0.9 % injection 3 mL  3 mL Intravenous PRN Ricki Rodriguez, MD       No current outpatient prescriptions on file as of 08/18/2011.    Results for orders placed during the hospital encounter of 08/17/11 (from the past 48 hour(s))  CBC     Status: Abnormal   Collection Time   08/17/11  8:16 PM      Component Value Range Comment   WBC 14.4 (*) 4.0 - 10.5 (K/uL)    RBC 5.45  4.22 - 5.81 (MIL/uL)    Hemoglobin 14.5  13.0 - 17.0 (g/dL)    HCT 16.1  09.6 - 04.5 (%)    MCV 80.4  78.0 - 100.0 (fL)    MCH 26.6  26.0 - 34.0 (pg)    MCHC 33.1  30.0 - 36.0 (g/dL)    RDW 40.9  81.1 - 91.4 (%)    Platelets 204  150 - 400 (K/uL)   POCT I-STAT TROPONIN I     Status: Normal   Collection Time   08/17/11  9:17 PM      Component Value Range Comment   Troponin i, poc 0.01  0.00 - 0.08 (ng/mL)    Comment 3            POCT I-STAT, CHEM 8     Status: Abnormal   Collection Time   08/17/11  9:20 PM      Component Value Range Comment   Sodium 141  135 - 145 (mEq/L)    Potassium 4.0  3.5 - 5.1 (mEq/L)    Chloride 103  96 - 112 (mEq/L)    BUN 18  6 - 23 (mg/dL)    Creatinine, Ser 1.61  0.50 - 1.35 (mg/dL)    Glucose, Bld 096 (*) 70 - 99 (mg/dL)    Calcium, Ion 0.45  1.12 - 1.32 (mmol/L)    TCO2 26  0 - 100 (mmol/L)    Hemoglobin 15.3  13.0 - 17.0 (g/dL)    HCT 40.9  81.1 - 91.4 (%)   CARDIAC PANEL(CRET KIN+CKTOT+MB+TROPI)     Status: Abnormal   Collection Time   08/17/11  9:57 PM      Component Value Range Comment   Total CK 285 (*) 7 - 232 (U/L)    CK, MB 7.0 (*) 0.3 - 4.0 (ng/mL)    Troponin I <0.30  <0.30 (ng/mL)    Relative Index 2.5  0.0 - 2.5    PROTIME-INR     Status: Normal   Collection Time   08/17/11  9:57 PM      Component Value Range Comment   Prothrombin Time 13.7  11.6 - 15.2 (seconds)    INR 1.03  0.00 - 1.49    APTT     Status: Normal   Collection Time   08/17/11  9:57 PM        Component Value Range Comment   aPTT 28  24 - 37 (seconds)   COMPREHENSIVE METABOLIC PANEL     Status: Abnormal   Collection Time   08/17/11  9:57 PM      Component Value Range Comment   Sodium 137  135 - 145 (mEq/L)    Potassium 3.8  3.5 - 5.1 (mEq/L)    Chloride 103  96 - 112 (mEq/L)    CO2 24  19 - 32 (mEq/L)    Glucose, Bld 128 (*) 70 - 99 (mg/dL)    BUN 16  6 - 23 (mg/dL)    Creatinine, Ser 7.82  0.50 - 1.35 (mg/dL)    Calcium 9.5  8.4 - 10.5 (mg/dL)    Total Protein 6.5  6.0 - 8.3 (g/dL)    Albumin 3.4 (*) 3.5 - 5.2 (g/dL)    AST 19  0 - 37 (U/L)    ALT 16  0 - 53 (U/L)    Alkaline Phosphatase 49  39 - 117 (U/L)    Total Bilirubin 0.5  0.3 - 1.2 (mg/dL)    GFR calc non Af Amer >90  >90 (mL/min)    GFR calc Af Amer >90  >90 (mL/min)   MAGNESIUM     Status: Normal   Collection Time   08/17/11  9:57 PM      Component Value Range Comment   Magnesium 2.0  1.5 - 2.5 (mg/dL)   CBC     Status: Abnormal   Collection Time  08/17/11  9:57 PM      Component Value Range Comment   WBC 11.6 (*) 4.0 - 10.5 (K/uL)    RBC 5.26  4.22 - 5.81 (MIL/uL)    Hemoglobin 14.2  13.0 - 17.0 (g/dL)    HCT 16.1  09.6 - 04.5 (%)    MCV 79.7  78.0 - 100.0 (fL)    MCH 27.0  26.0 - 34.0 (pg)    MCHC 33.9  30.0 - 36.0 (g/dL)    RDW 40.9  81.1 - 91.4 (%)    Platelets 211  150 - 400 (K/uL)   CARDIAC PANEL(CRET KIN+CKTOT+MB+TROPI)     Status: Abnormal   Collection Time   08/18/11  6:15 AM      Component Value Range Comment   Total CK 217  7 - 232 (U/L)    CK, MB 5.6 (*) 0.3 - 4.0 (ng/mL)    Troponin I <0.30  <0.30 (ng/mL)    Relative Index 2.6 (*) 0.0 - 2.5    CBC     Status: Normal   Collection Time   08/18/11  6:15 AM      Component Value Range Comment   WBC 6.8  4.0 - 10.5 (K/uL)    RBC 5.21  4.22 - 5.81 (MIL/uL)    Hemoglobin 13.7  13.0 - 17.0 (g/dL)    HCT 78.2  95.6 - 21.3 (%)    MCV 80.6  78.0 - 100.0 (fL)    MCH 26.3  26.0 - 34.0 (pg)    MCHC 32.6  30.0 - 36.0 (g/dL)    RDW  08.6  57.8 - 46.9 (%)    Platelets 205  150 - 400 (K/uL)   BASIC METABOLIC PANEL     Status: Normal   Collection Time   08/18/11  6:15 AM      Component Value Range Comment   Sodium 140  135 - 145 (mEq/L)    Potassium 4.1  3.5 - 5.1 (mEq/L)    Chloride 106  96 - 112 (mEq/L)    CO2 29  19 - 32 (mEq/L)    Glucose, Bld 99  70 - 99 (mg/dL)    BUN 12  6 - 23 (mg/dL)    Creatinine, Ser 6.29  0.50 - 1.35 (mg/dL)    Calcium 9.1  8.4 - 10.5 (mg/dL)    GFR calc non Af Amer >90  >90 (mL/min)    GFR calc Af Amer >90  >90 (mL/min)   LIPID PANEL     Status: Normal   Collection Time   08/18/11  6:15 AM      Component Value Range Comment   Cholesterol 132  0 - 200 (mg/dL)    Triglycerides 55  <528 (mg/dL)    HDL 42  >41 (mg/dL)    Total CHOL/HDL Ratio 3.1      VLDL 11  0 - 40 (mg/dL)    LDL Cholesterol 79  0 - 99 (mg/dL)    Dg Chest 2 View  32/44/0102  *RADIOLOGY REPORT*  Clinical Data: Syncope  CHEST - 2 VIEW  Comparison: Most recent 09/05/2010  Findings: Normal heart size.  Previous CABG. Bilateral costophrenic angle blunting is likely chronic, related to prior surgery.  COPD. No active infiltrates.  No evidence for heart failure.  Normal osseous structures.  IMPRESSION: Postsurgical changes as described.  No definite active infiltrates.  Original Report Authenticated By: Elsie Stain, M.D.    Blood pressure 127/79, pulse 86, temperature 97.8 F (  36.6 C), temperature source Oral, resp. rate 20, height 6' (1.829 m), weight 103.6 kg (228 lb 6.3 oz), SpO2 97.00%. General appearance: alert, cooperative and appears stated age Head: Normocephalic, without obvious abnormality, atraumatic Eyes: conjunctivae/corneas clear. PERRL, EOM's intact. Fundi benign. Ears: normal TM's and external ear canals both ears Nose: Nares normal. Septum midline. Mucosa normal. No drainage or sinus tenderness. Throat: lips, mucosa, and tongue normal; teeth and gums normal Neck: no adenopathy, no carotid bruit, no JVD  and supple, symmetrical, trachea midline Resp: clear to auscultation bilaterally Cardio: regular rate and rhythm, S1, S2 normal, no murmur, click, rub or gallop GI: soft, non-tender; bowel sounds normal; no masses,  no organomegaly Extremities: extremities normal, atraumatic, no cyanosis or edema Skin: Skin color, texture, turgor normal. No rashes or lesions Neurologic: Alert and oriented X 3, normal strength and tone. Normal symmetric reflexes. Normal coordination and gait  Assessment 1. Syncopy 2. CABG 3. CAD  Plan:  EKG and chest pain negative lexiscan myoview stress test. Nuclear images are pending  Jaclynn Laumann S 08/18/2011, 9:13 AM

## 2011-08-19 ENCOUNTER — Other Ambulatory Visit: Payer: Self-pay

## 2011-08-19 DIAGNOSIS — R55 Syncope and collapse: Secondary | ICD-10-CM

## 2011-08-19 MED ORDER — AMIODARONE HCL 200 MG PO TABS: 200.0000 mg | ORAL_TABLET | Freq: Every day | ORAL | Status: AC

## 2011-08-19 MED ORDER — METOPROLOL TARTRATE 25 MG PO TABS
25.0000 mg | ORAL_TABLET | Freq: Two times a day (BID) | ORAL | Status: DC
  Administered 2011-08-19: 25 mg via ORAL
  Filled 2011-08-19 (×2): qty 1

## 2011-08-19 NOTE — Progress Notes (Signed)
Charles Griffith is an 50 y.o. male.   Subjective: No chest pain. No dizziness. No dysrhythmia.  Past Medical History  Diagnosis Date  . Hypertension   . Hyperlipemia   . Angina   . Coronary artery disease   . CHF (congestive heart failure)   . Headache occasional  . Myocardial infarction       Past Surgical History  Procedure Date  . Coronary artery bypass graft     Family History  Problem Relation Age of Onset  . Anuerysm Father    Social History:  reports that he quit smoking about 7 years ago. His smoking use included Cigarettes. He has a 20 pack-year smoking history. He does not have any smokeless tobacco history on file. He reports that he does not drink alcohol or use illicit drugs.  Allergies: No Known Allergies   LIPID PANEL     Status: Normal   Collection Time   08/18/11  6:15 AM      Component Value Range Comment   Cholesterol 132  0 - 200 (mg/dL)    Triglycerides 55  <161 (mg/dL)    HDL 42  >09 (mg/dL)    Total CHOL/HDL Ratio 3.1      VLDL 11  0 - 40 (mg/dL)    LDL Cholesterol 79  0 - 99 (mg/dL)     Ct Head Wo Contrast  08/18/2011  *RADIOLOGY REPORT*  Clinical Data: Syncopal episodes.  Dizziness.  CT HEAD WITHOUT CONTRAST  Technique:  Contiguous axial images were obtained from the base of the skull through the vertex without contrast.  Comparison: None.  Findings: There is no evidence of intracranial hemorrhage, brain edema or other signs of acute infarction.  There is no evidence of intracranial mass lesion or mass effect.  No abnormal extra-axial fluid collections are identified.  Ventricles are normal in size.  No other intracranial abnormality identified.  No skull abnormality noted.  IMPRESSION: Negative noncontrast head CT.  Original Report Authenticated By: Danae Orleans, M.D.   Nm Myocar Multi W/spect W/wall Motion / Ef  08/18/2011  *RADIOLOGY REPORT*  Clinical Data:  50 year old with history of CABG, hypertension, and syncope.  MYOCARDIAL IMAGING WITH  SPECT (REST AND PHARMACOLOGIC-STRESS) GATED LEFT VENTRICULAR WALL MOTION STUDY LEFT VENTRICULAR EJECTION FRACTION  Technique:  Standard myocardial SPECT imaging was performed after resting intravenous injection of 10 mCi Tc-25m tetrofosmin. Subsequently, intravenous infusion of regadenoson was performed under the supervision of the Cardiology staff.  At peak effect of the drug, 30 mCi Tc-60m tetrofosmin was injected intravenously and standard myocardial SPECT  imaging was performed.  Quantitative gated imaging was also performed to evaluate left ventricular wall motion, and estimate left ventricular ejection fraction.  Comparison:  None.  Findings: Immediate post-regadenoson images demonstrate diminished uptake in the mid and distal inferior wall relative to the remainder of the myocardium.  Initial resting images with similar findings.  No evidence of reversibility to suggest ischemia. Findings confirmed by the computer generated polar map.  Review of the gated images demonstrates inferior wall and septal hypokinesis.  Estimated Q G S ejection fraction measured 44%, with an end- diastolic volume of 184 ml and an end-systolic volume of 103 ml.    IMPRESSION:  1.  No evidence of myocardial ischemia. 2.  Inferior wall infarction. 3.  Inferior wall and septal hypokinesis. 4.  Estimated Q G S ejection fraction 44%.  Original Report Authenticated By: Arnell Sieving, M.D.    Blood pressure 118/79, pulse 57, temperature  97.6 F (36.4 C), temperature source Oral, resp. rate 16, height 6' (1.829 m), weight 104.3 kg (229 lb 15 oz), SpO2 99.00%. General appearance: alert, cooperative and appears stated age Head: Normocephalic, without obvious abnormality, atraumatic Eyes: conjunctivae/corneas clear. PERRL, EOM's intact. Fundi benign. Ears: normal TM's and external ear canals both ears Nose: Nares normal. Septum midline. Mucosa normal. No drainage or sinus tenderness. Throat: lips, mucosa, and tongue normal;  teeth and gums normal Neck: no adenopathy, no carotid bruit, no JVD and supple, symmetrical, trachea midline Resp: clear to auscultation bilaterally Cardio: regular rate and rhythm, S1, S2 normal, no murmur, click, rub or gallop GI: soft, non-tender; bowel sounds normal; no masses,  no organomegaly Extremities: extremities normal, atraumatic, no cyanosis or edema Skin: Skin color, texture, turgor normal. No rashes or lesions Neurologic: Alert and oriented X 3, normal strength and tone. Normal symmetric reflexes. Normal coordination and gait  Assessment 1. Syncopy 2. CABG 3. CAD  Plan:  EKG and chest pain negative lexiscan myoview stress test. No ischemia. Awaiting Carotid doppler. Increase B-blocker dose Increase activity.  Sherron Mapp S 08/19/2011, 9:36 AM

## 2011-08-19 NOTE — Discharge Summary (Signed)
Physician Discharge Summary  Patient ID: Charles Griffith MRN: [id number] DOB/AGE: Oct 13, 1960 50 y.o.  Admit date: 08/17/2011 Discharge date: 08/19/2011  Admission Diagnoses: Syncope  Discharge Diagnoses:  Active Problems: Syncope Coronary artery disease Left ventricular systolic dysfucntion Hypertension Hyperlipidemia Obesity S/P CABG  Discharged Condition: good  Hospital Course: Patient was admitted to telebed. He had no dysrhythmia. No chest pain or additional syncope. His carotid doppler study was unremarkable. He underwent nuclear stress test that was negative for reversible ischemia. But had significant inferior wall scar and LV dysfunction with 44 % EF. He is already on Beta blocker. Will start low dose amiodarone for presumed cardiac dysrhythmia and undergo out patient holter or event monitoring. He will be followed by me in 2 weeks.   Consults: cardiology  Significant Diagnostic Studies: labs: Normal CBC, BMET and Troponin-I. Borderline CK/MB.                                                        Nuclear medicine: EF 44 %. No reversible ischemia.                                                        Carotid doppler: Unremarkable.                                                        CT brain without contrast: Unremarkable.  Treatments: cardiac meds: metoprolol  Discharge Exam: Blood pressure 117/72, pulse 60, temperature 97.8 F (36.6 C), temperature source Oral, resp. rate 18, height 6' (1.829 m), weight 104.3 kg (229 lb 15 oz), SpO2 98.00%. General appearance: alert, cooperative and appears stated age Head: Normocephalic, without obvious abnormality, atraumatic Eyes: conjunctivae/corneas clear. PERRL, EOM's intact. Fundi benign. Ears: normal TM's and external ear canals both ears Nose: Nares normal. Septum midline. Mucosa normal. No drainage or sinus tenderness. Throat: lips, mucosa, and tongue normal; teeth and gums normal Neck: no adenopathy, no carotid bruit, no  JVD, supple, symmetrical, trachea midline and thyroid not enlarged, symmetric, no tenderness/mass/nodules Resp: clear to auscultation bilaterally Cardio: regular rate and rhythm, S1, S2 normal, no murmur, click, rub or gallop GI: soft, non-tender; bowel sounds normal; no masses,  no organomegaly Extremities: extremities normal, atraumatic, no cyanosis or edema Skin: Skin color, texture, turgor normal. No rashes or lesions Neurologic: Alert and oriented X 3, normal strength and tone. Normal symmetric reflexes. Normal coordination and gait  Disposition: Home   Current Discharge Medication List    START taking these medications   Details  amiodarone (PACERONE) 200 MG tablet Take 1 tablet (200 mg total) by mouth daily. Qty: 1 tablet, Refills: 3      CONTINUE these medications which have NOT CHANGED   Details  aspirin 325 MG tablet Take 325 mg by mouth daily.      B Complex Vitamins (B COMPLEX PO) Take 1 tablet by mouth once a week. Takes on sundays     CALCIUM PO Take 1 tablet by mouth See admin  instructions. Patient takes Monday thru Friday only     metoprolol tartrate (LOPRESSOR) 25 MG tablet Take 25 mg by mouth 2 (two) times daily.      niacin (NIASPAN) 500 MG CR tablet Take 500 mg by mouth daily.      Omega-3 Fatty Acids (FISH OIL PO) Take 1 capsule by mouth daily.      pravastatin (PRAVACHOL) 40 MG tablet Take 40 mg by mouth daily.         Follow-up Information    Follow up with [facility] S, MD. Make an appointment in 2 weeks.   Contact information:   [address] [place] [address] [phone]          Signed: Ricki Rodriguez 08/19/2011, 5:06 PM

## 2011-08-19 NOTE — Progress Notes (Signed)
Carotid doppler performed.  Bilateral:  No evidence of hemodynamically significant internal artery stenosis.        Vertebral artery flow is antegrade.     Charles Griffith 08/19/2011, 2:41 PM

## 2012-08-22 ENCOUNTER — Encounter (HOSPITAL_COMMUNITY): Payer: Self-pay | Admitting: *Deleted

## 2012-08-22 ENCOUNTER — Observation Stay (HOSPITAL_COMMUNITY)
Admission: AD | Admit: 2012-08-22 | Discharge: 2012-08-23 | Disposition: A | Discharge status: Home / Self Care | Payer: Self-pay | Source: Ambulatory Visit | Attending: Cardiovascular Disease | Admitting: Cardiovascular Disease

## 2012-08-22 ENCOUNTER — Observation Stay (HOSPITAL_COMMUNITY): Payer: Self-pay

## 2012-08-22 DIAGNOSIS — I10 Essential (primary) hypertension: Secondary | ICD-10-CM | POA: Insufficient documentation

## 2012-08-22 DIAGNOSIS — I509 Heart failure, unspecified: Secondary | ICD-10-CM | POA: Insufficient documentation

## 2012-08-22 DIAGNOSIS — E785 Hyperlipidemia, unspecified: Secondary | ICD-10-CM | POA: Insufficient documentation

## 2012-08-22 DIAGNOSIS — I2510 Atherosclerotic heart disease of native coronary artery without angina pectoris: Secondary | ICD-10-CM | POA: Insufficient documentation

## 2012-08-22 DIAGNOSIS — I252 Old myocardial infarction: Secondary | ICD-10-CM | POA: Insufficient documentation

## 2012-08-22 DIAGNOSIS — E669 Obesity, unspecified: Secondary | ICD-10-CM | POA: Insufficient documentation

## 2012-08-22 DIAGNOSIS — R079 Chest pain, unspecified: Principal | ICD-10-CM | POA: Insufficient documentation

## 2012-08-22 LAB — CBC WITH DIFFERENTIAL/PLATELET
Basophils Absolute: 0 10*3/uL (ref 0.0–0.1)
Eosinophils Relative: 2 % (ref 0–5)
Lymphocytes Relative: 24 % (ref 12–46)
MCV: 81.3 fL (ref 78.0–100.0)
Neutro Abs: 4.5 10*3/uL (ref 1.7–7.7)
Neutrophils Relative %: 64 % (ref 43–77)
Platelets: 185 10*3/uL (ref 150–400)
RDW: 13.6 % (ref 11.5–15.5)
WBC: 7 10*3/uL (ref 4.0–10.5)

## 2012-08-22 LAB — PROTIME-INR
INR: 0.94 (ref 0.00–1.49)
Prothrombin Time: 12.5 seconds (ref 11.6–15.2)

## 2012-08-22 LAB — COMPREHENSIVE METABOLIC PANEL
ALT: 35 U/L (ref 0–53)
AST: 25 U/L (ref 0–37)
CO2: 27 mEq/L (ref 19–32)
Calcium: 9.6 mg/dL (ref 8.4–10.5)
GFR calc non Af Amer: 83 mL/min — ABNORMAL LOW (ref >90)
Potassium: 4.5 mEq/L (ref 3.5–5.1)
Sodium: 137 mEq/L (ref 135–145)

## 2012-08-22 LAB — APTT: aPTT: 29 seconds (ref 24–37)

## 2012-08-22 MED ORDER — SODIUM CHLORIDE 0.9 % IJ SOLN: 3.0000 mL | Freq: Two times a day (BID) | INTRAMUSCULAR | Status: DC

## 2012-08-22 MED ORDER — AMIODARONE HCL 200 MG PO TABS
200.0000 mg | ORAL_TABLET | Freq: Every day | ORAL | Status: DC
  Administered 2012-08-23: 200 mg via ORAL
  Filled 2012-08-22 (×2): qty 1

## 2012-08-22 MED ORDER — NITROGLYCERIN 0.4 MG SL SUBL: 0.4000 mg | SUBLINGUAL_TABLET | SUBLINGUAL | Status: DC | PRN

## 2012-08-22 MED ORDER — HEPARIN (PORCINE) IN NACL 100-0.45 UNIT/ML-% IJ SOLN
1350.0000 [IU]/h | INTRAMUSCULAR | Status: DC
  Administered 2012-08-22 – 2012-08-23 (×2): 1350 [IU]/h via INTRAVENOUS
  Filled 2012-08-22 (×3): qty 250

## 2012-08-22 MED ORDER — ASPIRIN EC 81 MG PO TBEC
81.0000 mg | DELAYED_RELEASE_TABLET | Freq: Every day | ORAL | Status: DC
  Administered 2012-08-23: 81 mg via ORAL
  Filled 2012-08-22: qty 1

## 2012-08-22 MED ORDER — ASPIRIN 300 MG RE SUPP
300.0000 mg | RECTAL | Status: AC
  Filled 2012-08-22: qty 1

## 2012-08-22 MED ORDER — HEPARIN BOLUS VIA INFUSION
4000.0000 [IU] | Freq: Once | INTRAVENOUS | Status: AC
  Administered 2012-08-22: 4000 [IU] via INTRAVENOUS
  Filled 2012-08-22: qty 4000

## 2012-08-22 MED ORDER — ATORVASTATIN CALCIUM 20 MG PO TABS
20.0000 mg | ORAL_TABLET | Freq: Every day | ORAL | Status: DC
  Filled 2012-08-22 (×2): qty 1

## 2012-08-22 MED ORDER — SODIUM CHLORIDE 0.9 % IJ SOLN: 3.0000 mL | INTRAMUSCULAR | Status: DC | PRN

## 2012-08-22 MED ORDER — ASPIRIN 81 MG PO CHEW
324.0000 mg | CHEWABLE_TABLET | ORAL | Status: AC
  Administered 2012-08-22: 324 mg via ORAL
  Filled 2012-08-22: qty 4

## 2012-08-22 MED ORDER — CLOPIDOGREL BISULFATE 75 MG PO TABS
75.0000 mg | ORAL_TABLET | Freq: Every day | ORAL | Status: DC
  Administered 2012-08-23: 75 mg via ORAL
  Filled 2012-08-22: qty 1

## 2012-08-22 MED ORDER — METOPROLOL TARTRATE 12.5 MG HALF TABLET
12.5000 mg | ORAL_TABLET | Freq: Two times a day (BID) | ORAL | Status: DC
  Administered 2012-08-22: 12.5 mg via ORAL
  Filled 2012-08-22 (×3): qty 1

## 2012-08-22 MED ORDER — ACETAMINOPHEN 325 MG PO TABS: 650.0000 mg | ORAL_TABLET | ORAL | Status: DC | PRN

## 2012-08-22 MED ORDER — ONDANSETRON HCL 4 MG/2ML IJ SOLN: 4.0000 mg | Freq: Four times a day (QID) | INTRAMUSCULAR | Status: DC | PRN

## 2012-08-22 MED ORDER — SODIUM CHLORIDE 0.9 % IV SOLN: 250.0000 mL | INTRAVENOUS | Status: DC | PRN

## 2012-08-22 NOTE — Progress Notes (Signed)
ANTICOAGULATION CONSULT NOTE - Follow Up Consult  Pharmacy Consult for heparin Indication: chest pain/ACS  Labs:  [facility] 08/22/12 2320 08/22/12 1719 08/22/12 1718  HGB -- 14.3 --  HCT -- 42.3 --  PLT -- 185 --  APTT -- 29 --  LABPROT -- 12.5 --  INR -- 0.94 --  HEPARINUNFRC 0.42 -- --  CREATININE -- 1.01 --  CKTOTAL -- -- --  CKMB -- -- --  TROPONINI -- -- <0.30    Assessment/Plan:  51yo male therapeutic on heparin with initial dosing for CP.  Will continue gtt at current rate and confirm stable with am labs.  Colleen Can PharmD BCPS 08/22/2012,11:51 PM

## 2012-08-22 NOTE — H&P (Signed)
Charles Griffith is an 51 y.o. male.   Chief Complaint: Chest pain HPI: 51 years old male with 4 vessel CABG 06/2010, has 1 day history of sharp chest pains off and on turning into fullness and left arm weakness. No sweating or nausea. Some shortness of breath.   Past Medical History  Diagnosis Date  . Hypertension   . Hyperlipemia   . Angina   . Coronary artery disease   . CHF (congestive heart failure)   . Headache occasional  . Myocardial infarction       Past Surgical History  Procedure Date  . Coronary artery bypass graft     Family History  Problem Relation Age of Onset  . Anuerysm Father    Social History:  reports that he quit smoking about 8 years ago. His smoking use included Cigarettes. He has a 20 pack-year smoking history. He does not have any smokeless tobacco history on file. He reports that he does not drink alcohol or use illicit drugs.  Allergies: No Known Allergies  Medications Prior to Admission  Medication Sig Dispense Refill  . amiodarone (PACERONE) 200 MG tablet Take 200 mg by mouth daily.      Marland Kitchen aspirin 325 MG tablet Take 325 mg by mouth daily.        . B Complex Vitamins (B COMPLEX PO) Take 1 tablet by mouth once a week. Takes on sundays       . CALCIUM PO Take 1 tablet by mouth See admin instructions. Patient takes Monday thru Friday only      . docusate sodium (COLACE) 100 MG capsule Take 200 mg by mouth daily.      . ferrous sulfate 325 (65 FE) MG EC tablet Take 325 mg by mouth See admin instructions. On Sat and Sun      . fish oil-omega-3 fatty acids 1000 MG capsule Take 2 g by mouth daily.      Marland Kitchen ibuprofen (ADVIL,MOTRIN) 200 MG tablet Take 400 mg by mouth every 6 (six) hours as needed. For pain      . metoprolol tartrate (LOPRESSOR) 25 MG tablet Take 25 mg by mouth 2 (two) times daily.        . niacin (NIASPAN) 500 MG CR tablet Take 500 mg by mouth daily.        . pravastatin (PRAVACHOL) 40 MG tablet Take 40 mg by mouth daily.        Marland Kitchen amiodarone  (PACERONE) 200 MG tablet Take 1 tablet (200 mg total) by mouth daily.  1 tablet  3    Results for orders placed during the hospital encounter of 08/22/12 (from the past 48 hour(s))  TROPONIN I     Status: Normal   Collection Time   08/22/12  5:18 PM      Component Value Range Comment   Troponin I <0.30  <0.30 ng/mL   PROTIME-INR     Status: Normal   Collection Time   08/22/12  5:19 PM      Component Value Range Comment   Prothrombin Time 12.5  11.6 - 15.2 seconds    INR 0.94  0.00 - 1.49   APTT     Status: Normal   Collection Time   08/22/12  5:19 PM      Component Value Range Comment   aPTT 29  24 - 37 seconds   CBC WITH DIFFERENTIAL     Status: Normal   Collection Time   08/22/12  5:19 PM  Component Value Range Comment   WBC 7.0  4.0 - 10.5 K/uL    RBC 5.20  4.22 - 5.81 MIL/uL    Hemoglobin 14.3  13.0 - 17.0 g/dL    HCT 96.0  45.4 - 09.8 %    MCV 81.3  78.0 - 100.0 fL    MCH 27.5  26.0 - 34.0 pg    MCHC 33.8  30.0 - 36.0 g/dL    RDW 11.9  14.7 - 82.9 %    Platelets 185  150 - 400 K/uL    Neutrophils Relative 64  43 - 77 %    Neutro Abs 4.5  1.7 - 7.7 K/uL    Lymphocytes Relative 24  12 - 46 %    Lymphs Abs 1.7  0.7 - 4.0 K/uL    Monocytes Relative 9  3 - 12 %    Monocytes Absolute 0.6  0.1 - 1.0 K/uL    Eosinophils Relative 2  0 - 5 %    Eosinophils Absolute 0.1  0.0 - 0.7 K/uL    Basophils Relative 1  0 - 1 %    Basophils Absolute 0.0  0.0 - 0.1 K/uL   COMPREHENSIVE METABOLIC PANEL     Status: Abnormal   Collection Time   08/22/12  5:19 PM      Component Value Range Comment   Sodium 137  135 - 145 mEq/L    Potassium 4.5  3.5 - 5.1 mEq/L    Chloride 100  96 - 112 mEq/L    CO2 27  19 - 32 mEq/L    Glucose, Bld 82  70 - 99 mg/dL    BUN 14  6 - 23 mg/dL    Creatinine, Ser 5.62  0.50 - 1.35 mg/dL    Calcium 9.6  8.4 - 13.0 mg/dL    Total Protein 7.0  6.0 - 8.3 g/dL    Albumin 4.0  3.5 - 5.2 g/dL    AST 25  0 - 37 U/L    ALT 35  0 - 53 U/L    Alkaline  Phosphatase 42  39 - 117 U/L    Total Bilirubin 0.3  0.3 - 1.2 mg/dL    GFR calc non Af Amer 83 (*) >90 mL/min    GFR calc Af Amer >90  >90 mL/min    No results found.  @ROS @ No weight gain or loss. No vision or hearing problem. No pneumonia or asthma. + Hypertension and hyperlipidemia No GI bleed, hepatitis, Vomiting or diarrhea. No kidney stone or hematuria. No stroke, seizures or psych admission  Blood pressure 145/88, pulse 65, temperature 98.2 F (36.8 C), temperature source Oral, resp. rate 20, height 5\' 9"  (1.753 m), weight 112.038 kg (247 lb), SpO2 98.00%.  General appearance: alert, cooperative and appears stated age  Head: Normocephalic, atraumatic.  Eyes: Conjunctivae/corneas clear. PERRL, EOM's intact.   Ears: Normal TM's and external ear canals both ears  Nose: Nares normal. Septum midline. Mucosa normal. No drainage or sinus tenderness.  Throat: lips, mucosa, and tongue normal; teeth and gums normal  Neck: No adenopathy, no carotid bruit, no JVD and supple, symmetrical, trachea midline  Resp: clear to auscultation bilaterally.  Cardio: regular rate and rhythm, S1, S2 normal, no murmur, click, rub or gallop  GI: soft, non-tender; bowel sounds normal; no masses, no organomegaly.  Extremities: extremities normal, atraumatic, no cyanosis or edema  Skin: Skin color, texture, turgor normal. No rashes or lesions  Neurologic: Alert and oriented  X 3, normal strength and tone. Normal symmetric reflexes. Normal coordination and gait.  Assessment/Plan Chest pain r/o MI CABG  Nuclear stress test v/s cardiac cath. Serial Troponin I's. Home medications  Jaslynne Dahan S 08/22/2012, 8:08 PM

## 2012-08-22 NOTE — Progress Notes (Signed)
ANTICOAGULATION CONSULT NOTE - Initial Consult  Pharmacy Consult for heparin Indication: chest pain/ACS  No Known Allergies  Patient Measurements: Height: 5\' 9"  (175.3 cm) Weight: 247 lb (112.038 kg) IBW/kg (Calculated) : 70.7  Heparin Dosing Weight: 97 kg  Vital Signs: Temp: 98.2 F (36.8 C) (11/21 1500) Temp src: Oral (11/21 1500) BP: 145/88 mmHg (11/21 1500) Pulse Rate: 65  (11/21 1500)  Labs: No results found for this basename: HGB:2,HCT:3,PLT:3,APTT:3,LABPROT:3,INR:3,HEPARINUNFRC:3,CREATININE:3,CKTOTAL:3,CKMB:3,TROPONINI:3 in the last 72 hours  Estimated Creatinine Clearance: 124 ml/min (by C-G formula based on Cr of 0.85).   Medical History: Past Medical History  Diagnosis Date  . Hypertension   . Hyperlipemia   . Angina   . Coronary artery disease   . CHF (congestive heart failure)   . Headache occasional  . Myocardial infarction     Medications:  Prescriptions prior to admission  Medication Sig Dispense Refill  . amiodarone (PACERONE) 200 MG tablet Take 1 tablet (200 mg total) by mouth daily.  1 tablet  3  . aspirin 325 MG tablet Take 325 mg by mouth daily.        . B Complex Vitamins (B COMPLEX PO) Take 1 tablet by mouth once a week. Takes on sundays       . CALCIUM PO Take 1 tablet by mouth See admin instructions. Patient takes Monday thru Friday only       . metoprolol tartrate (LOPRESSOR) 25 MG tablet Take 25 mg by mouth 2 (two) times daily.        . niacin (NIASPAN) 500 MG CR tablet Take 500 mg by mouth daily.        . Omega-3 Fatty Acids (FISH OIL PO) Take 1 capsule by mouth daily.        . pravastatin (PRAVACHOL) 40 MG tablet Take 40 mg by mouth daily.          Assessment: 51 year old man admitted with chest pain to rule out ACS Goal of Therapy:  Heparin level 0.3-0.7 units/ml Monitor platelets by anticoagulation protocol: Yes   Plan:  Give 4000 units bolus x 1 Start heparin infusion at 1350 units/hr Check anti-Xa level in 6 hours and daily  while on heparin Continue to monitor H&H and platelets  Mickeal Skinner 08/22/2012,5:19 PM

## 2012-08-23 ENCOUNTER — Observation Stay (HOSPITAL_COMMUNITY): Payer: Self-pay

## 2012-08-23 LAB — LIPID PANEL
HDL: 56 mg/dL (ref >39)
LDL Cholesterol: 108 mg/dL — ABNORMAL HIGH (ref 0–99)
Total CHOL/HDL Ratio: 3.2 RATIO

## 2012-08-23 LAB — BASIC METABOLIC PANEL
CO2: 24 mEq/L (ref 19–32)
Calcium: 9.5 mg/dL (ref 8.4–10.5)
Chloride: 103 mEq/L (ref 96–112)
Creatinine, Ser: 0.95 mg/dL (ref 0.50–1.35)
Glucose, Bld: 90 mg/dL (ref 70–99)
Sodium: 139 mEq/L (ref 135–145)

## 2012-08-23 LAB — TSH: TSH: 5.964 u[IU]/mL — ABNORMAL HIGH (ref 0.350–4.500)

## 2012-08-23 LAB — TROPONIN I: Troponin I: <0.3 ng/mL (ref <0.30)

## 2012-08-23 LAB — CBC
HCT: 42.1 % (ref 39.0–52.0)
Hemoglobin: 14.5 g/dL (ref 13.0–17.0)
MCH: 28 pg (ref 26.0–34.0)
MCHC: 34.4 g/dL (ref 30.0–36.0)
MCV: 81.3 fL (ref 78.0–100.0)
Platelets: 173 10*3/uL (ref 150–400)
RBC: 5.18 MIL/uL (ref 4.22–5.81)
RDW: 13.9 % (ref 11.5–15.5)
WBC: 6.8 10*3/uL (ref 4.0–10.5)

## 2012-08-23 MED ORDER — TECHNETIUM TC 99M SESTAMIBI GENERIC - CARDIOLITE
10.0000 | Freq: Once | INTRAVENOUS | Status: AC | PRN
  Administered 2012-08-23: 10 via INTRAVENOUS

## 2012-08-23 MED ORDER — TECHNETIUM TC 99M SESTAMIBI GENERIC - CARDIOLITE
30.0000 | Freq: Once | INTRAVENOUS | Status: AC | PRN
  Administered 2012-08-23: 30 via INTRAVENOUS

## 2012-08-23 MED ORDER — NITROGLYCERIN 0.4 MG SL SUBL: 0.4000 mg | SUBLINGUAL_TABLET | SUBLINGUAL | Status: AC | PRN

## 2012-08-23 MED ORDER — REGADENOSON 0.4 MG/5ML IV SOLN
0.4000 mg | Freq: Once | INTRAVENOUS | Status: AC
  Administered 2012-08-23: 0.4 mg via INTRAVENOUS
  Filled 2012-08-23: qty 5

## 2012-08-23 NOTE — Progress Notes (Signed)
Pt stable, assessment unchanged from this am. D/'cd via wheelchair to private vehicle

## 2012-08-23 NOTE — Discharge Summary (Signed)
Physician Discharge Summary  Patient ID: Charles Griffith MRN: [id number] DOB/AGE: 53/53/1962 53 y.o.  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 His blood work was normal and his nuclear stress test showed inferior infarct with peri-infarct ischemia and EF 45 %. Hence he was discharged home with addition of sublingual NTG as needed.  Consults: None  Significant Diagnostic Studies: labs: Normal CBC, BMET and Troponin I x 3.   EKG-SR, IVCD.   Nuclear stress test with infero-lateral scar.  Treatments: cardiac meds: amiodarone  Discharge Exam: Blood pressure 132/81, pulse 56, temperature 97.5 F (36.4 C), temperature source Oral, resp. rate 20, height 5\' 9"  (1.753 m), weight 112.038 kg (247 lb), SpO2 99.00%.   Disposition: 01-Home or Self Care General appearance: alert, cooperative and appears stated age  Head: Normocephalic, atraumatic.  Eyes: Conjunctivae/corneas clear. PERRL, EOM's intact.  Ears: Normal TM's and external ear canals both ears  Nose: Nares normal. Septum midline. Mucosa normal. No drainage or sinus tenderness.  Throat: lips, mucosa, and tongue normal; teeth and gums normal  Neck: No adenopathy, no carotid bruit, no JVD and supple, symmetrical, trachea midline  Resp: clear to auscultation bilaterally.  Cardio: regular rate and rhythm, S1, S2 normal, no murmur, click, rub or gallop  GI: soft, non-tender; bowel sounds normal; no masses, no organomegaly.  Extremities: extremities normal, atraumatic, no cyanosis or edema  Skin: Skin color, texture, turgor normal. No rashes or lesions  Neurologic: Alert and oriented X 3, normal strength and tone. Normal symmetric reflexes. Normal coordination and gait.     Medication  List     As of 08/23/2012  3:09 PM    TAKE these medications         amiodarone 200 MG tablet   Commonly known as: PACERONE   Take 1 tablet (200 mg total) by mouth daily.      amiodarone 200 MG tablet   Commonly known as: PACERONE   Take 200 mg by mouth daily.      aspirin 325 MG tablet   Take 325 mg by mouth daily.      B COMPLEX PO   Take 1 tablet by mouth once a week. Takes on sundays      CALCIUM PO   Take 1 tablet by mouth See admin instructions. Patient takes Monday thru Friday only      docusate sodium 100 MG capsule   Commonly known as: COLACE   Take 200 mg by mouth daily.      ferrous sulfate 325 (65 FE) MG EC tablet   Take 325 mg by mouth See admin instructions. On Sat and Sun      fish oil-omega-3 fatty acids 1000 MG capsule   Take 2 g by mouth daily.      ibuprofen 200 MG tablet   Commonly known as: ADVIL,MOTRIN   Take 400 mg by mouth every 6 (six) hours as needed. For pain      metoprolol tartrate 25 MG tablet   Commonly known as: LOPRESSOR   Take 25 mg by mouth 2 (two) times daily.      niacin 500 MG CR tablet   Commonly known as: NIASPAN   Take 500 mg by mouth daily.      nitroGLYCERIN 0.4 MG SL tablet   Commonly known  as: NITROSTAT   Place 1 tablet (0.4 mg total) under the tongue every 5 (five) minutes x 3 doses as needed for chest pain.      pravastatin 40 MG tablet   Commonly known as: PRAVACHOL   Take 40 mg by mouth daily.         SignedOrpah Cobb S 08/23/2012, 3:09 PM

## 2012-08-23 NOTE — Progress Notes (Signed)
ANTICOAGULATION CONSULT NOTE - Follow Up Consult  Pharmacy Consult for heparin Indication: chest pain/ACS  Labs:  Basename 08/23/12 0430 08/22/12 2320 08/22/12 1719 08/22/12 1718  HGB 14.5 -- 14.3 --  HCT 42.1 -- 42.3 --  PLT 173 -- 185 --  APTT -- -- 29 --  LABPROT -- -- 12.5 --  INR -- -- 0.94 --  HEPARINUNFRC 0.39 0.42 -- --  CREATININE 0.95 -- 1.01 --  CKTOTAL -- -- -- --  CKMB -- -- -- --  TROPONINI <0.30 <0.30 -- <0.30    Assessment:  51yo male therapeutic on heparin for CP. CBC stable. No bleeding noted. Plan for nuclear test vs cardiac cath today.  Plan: 1) Continue heparin gtt at 1350 units/hr 2) F/u daily heparin level and CBC  Christoper Fabian, PharmD, BCPS Clinical pharmacist, pager [phone] 08/23/2012,8:38 AM

## 2012-08-23 NOTE — Progress Notes (Signed)
Utilization review completed.  

## 2012-11-16 ENCOUNTER — Other Ambulatory Visit: Payer: Self-pay

## 2013-08-07 ENCOUNTER — Other Ambulatory Visit: Payer: Self-pay

## 2018-12-20 ENCOUNTER — Emergency Department (HOSPITAL_COMMUNITY): Payer: BLUE CROSS/BLUE SHIELD

## 2018-12-20 ENCOUNTER — Encounter (HOSPITAL_COMMUNITY): Payer: Self-pay

## 2018-12-20 ENCOUNTER — Inpatient Hospital Stay (HOSPITAL_COMMUNITY)
Admission: EM | Admit: 2018-12-20 | Discharge: 2018-12-22 | DRG: 054 | Disposition: A | Discharge status: Home / Self Care | Payer: BLUE CROSS/BLUE SHIELD | Attending: Neurological Surgery | Admitting: Neurological Surgery

## 2018-12-20 ENCOUNTER — Other Ambulatory Visit: Payer: Self-pay

## 2018-12-20 DIAGNOSIS — E785 Hyperlipidemia, unspecified: Secondary | ICD-10-CM | POA: Diagnosis not present

## 2018-12-20 DIAGNOSIS — E669 Obesity, unspecified: Secondary | ICD-10-CM | POA: Diagnosis present

## 2018-12-20 DIAGNOSIS — D329 Benign neoplasm of meninges, unspecified: Secondary | ICD-10-CM | POA: Diagnosis present

## 2018-12-20 DIAGNOSIS — I252 Old myocardial infarction: Secondary | ICD-10-CM | POA: Diagnosis not present

## 2018-12-20 DIAGNOSIS — R29700 NIHSS score 0: Secondary | ICD-10-CM | POA: Diagnosis present

## 2018-12-20 DIAGNOSIS — Z79899 Other long term (current) drug therapy: Secondary | ICD-10-CM

## 2018-12-20 DIAGNOSIS — C7931 Secondary malignant neoplasm of brain: Secondary | ICD-10-CM | POA: Diagnosis not present

## 2018-12-20 DIAGNOSIS — Z7982 Long term (current) use of aspirin: Secondary | ICD-10-CM | POA: Diagnosis not present

## 2018-12-20 DIAGNOSIS — R569 Unspecified convulsions: Secondary | ICD-10-CM

## 2018-12-20 DIAGNOSIS — I25811 Atherosclerosis of native coronary artery of transplanted heart without angina pectoris: Secondary | ICD-10-CM | POA: Diagnosis not present

## 2018-12-20 DIAGNOSIS — Z6841 Body Mass Index (BMI) 40.0 and over, adult: Secondary | ICD-10-CM | POA: Diagnosis not present

## 2018-12-20 DIAGNOSIS — R918 Other nonspecific abnormal finding of lung field: Secondary | ICD-10-CM | POA: Diagnosis present

## 2018-12-20 DIAGNOSIS — Z951 Presence of aortocoronary bypass graft: Secondary | ICD-10-CM

## 2018-12-20 DIAGNOSIS — G40909 Epilepsy, unspecified, not intractable, without status epilepticus: Secondary | ICD-10-CM | POA: Diagnosis present

## 2018-12-20 DIAGNOSIS — I2510 Atherosclerotic heart disease of native coronary artery without angina pectoris: Secondary | ICD-10-CM | POA: Diagnosis present

## 2018-12-20 DIAGNOSIS — G9389 Other specified disorders of brain: Secondary | ICD-10-CM

## 2018-12-20 DIAGNOSIS — Z8249 Family history of ischemic heart disease and other diseases of the circulatory system: Secondary | ICD-10-CM | POA: Diagnosis not present

## 2018-12-20 DIAGNOSIS — I447 Left bundle-branch block, unspecified: Secondary | ICD-10-CM | POA: Diagnosis present

## 2018-12-20 DIAGNOSIS — G936 Cerebral edema: Secondary | ICD-10-CM | POA: Diagnosis present

## 2018-12-20 DIAGNOSIS — D496 Neoplasm of unspecified behavior of brain: Secondary | ICD-10-CM | POA: Diagnosis present

## 2018-12-20 DIAGNOSIS — Z87891 Personal history of nicotine dependence: Secondary | ICD-10-CM | POA: Diagnosis not present

## 2018-12-20 DIAGNOSIS — I10 Essential (primary) hypertension: Secondary | ICD-10-CM | POA: Diagnosis present

## 2018-12-20 DIAGNOSIS — C801 Malignant (primary) neoplasm, unspecified: Secondary | ICD-10-CM | POA: Diagnosis present

## 2018-12-20 DIAGNOSIS — R0789 Other chest pain: Secondary | ICD-10-CM | POA: Diagnosis present

## 2018-12-20 LAB — CBG MONITORING, ED: Glucose-Capillary: 110 mg/dL — ABNORMAL HIGH (ref 70–99)

## 2018-12-20 LAB — DIFFERENTIAL
Abs Immature Granulocytes: 0.06 10*3/uL (ref 0.00–0.07)
Basophils Absolute: 0.1 10*3/uL (ref 0.0–0.1)
Basophils Relative: 1 %
EOS PCT: 3 %
Eosinophils Absolute: 0.4 10*3/uL (ref 0.0–0.5)
Immature Granulocytes: 1 %
Lymphocytes Relative: 25 %
Lymphs Abs: 3.2 10*3/uL (ref 0.7–4.0)
MONO ABS: 1.2 10*3/uL — AB (ref 0.1–1.0)
Monocytes Relative: 9 %
Neutro Abs: 7.8 10*3/uL — ABNORMAL HIGH (ref 1.7–7.7)
Neutrophils Relative %: 61 %

## 2018-12-20 LAB — COMPREHENSIVE METABOLIC PANEL
ALT: 24 U/L (ref 0–44)
AST: 30 U/L (ref 15–41)
Albumin: 4.1 g/dL (ref 3.5–5.0)
Alkaline Phosphatase: 67 U/L (ref 38–126)
Anion gap: 13 (ref 5–15)
BUN: 14 mg/dL (ref 6–20)
CO2: 20 mmol/L — ABNORMAL LOW (ref 22–32)
Calcium: 9.2 mg/dL (ref 8.9–10.3)
Chloride: 108 mmol/L (ref 98–111)
Creatinine, Ser: 1.38 mg/dL — ABNORMAL HIGH (ref 0.61–1.24)
GFR calc Af Amer: >60 mL/min (ref >60)
GFR calc non Af Amer: 56 mL/min — ABNORMAL LOW (ref >60)
Glucose, Bld: 133 mg/dL — ABNORMAL HIGH (ref 70–99)
Potassium: 3.6 mmol/L (ref 3.5–5.1)
Sodium: 141 mmol/L (ref 135–145)
Total Bilirubin: 0.6 mg/dL (ref 0.3–1.2)
Total Protein: 7.2 g/dL (ref 6.5–8.1)

## 2018-12-20 LAB — CBC
HCT: 42.5 % (ref 39.0–52.0)
Hemoglobin: 12.9 g/dL — ABNORMAL LOW (ref 13.0–17.0)
MCH: 26.4 pg (ref 26.0–34.0)
MCHC: 30.4 g/dL (ref 30.0–36.0)
MCV: 86.9 fL (ref 80.0–100.0)
Platelets: 252 10*3/uL (ref 150–400)
RBC: 4.89 MIL/uL (ref 4.22–5.81)
RDW: 13.9 % (ref 11.5–15.5)
WBC: 12.8 10*3/uL — ABNORMAL HIGH (ref 4.0–10.5)
nRBC: 0 % (ref 0.0–0.2)

## 2018-12-20 LAB — I-STAT CREATININE, ED: Creatinine, Ser: 1.2 mg/dL (ref 0.61–1.24)

## 2018-12-20 LAB — PROTIME-INR
INR: 1.1 (ref 0.8–1.2)
PROTHROMBIN TIME: 13.6 s (ref 11.4–15.2)

## 2018-12-20 LAB — APTT: APTT: 25 s (ref 24–36)

## 2018-12-20 MED ORDER — SODIUM CHLORIDE 0.9% FLUSH: 3.0000 mL | Freq: Once | INTRAVENOUS | Status: DC

## 2018-12-20 MED ORDER — LEVETIRACETAM IN NACL 1000 MG/100ML IV SOLN
1000.0000 mg | Freq: Once | INTRAVENOUS | Status: AC
  Administered 2018-12-20: 1000 mg via INTRAVENOUS
  Filled 2018-12-20: qty 100

## 2018-12-20 MED ORDER — DEXAMETHASONE SODIUM PHOSPHATE 10 MG/ML IJ SOLN
10.0000 mg | Freq: Once | INTRAMUSCULAR | Status: AC
  Administered 2018-12-20: 10 mg via INTRAVENOUS
  Filled 2018-12-20: qty 1

## 2018-12-20 MED ORDER — SODIUM CHLORIDE 0.9 % IV SOLN
750.0000 mg | Freq: Two times a day (BID) | INTRAVENOUS | Status: DC
  Administered 2018-12-21 – 2018-12-22 (×3): 750 mg via INTRAVENOUS
  Filled 2018-12-20 (×4): qty 7.5

## 2018-12-20 NOTE — Consult Note (Addendum)
Requesting Physician: Dr. Jeanell Sparrow    Chief Complaint: Passed out  History obtained from: Patient and Chart   HPI:                                                                                                                                       Charles Griffith is an 58 y.o. male with past medical history of coronary artery disease, hypertension hyperlipidemia who presents as a code stroke after patient suddenly became unconscious while sitting at the dining table. According to his wife, patient suddenly became unresponsive his son prevented him from falling.  Head turned to the right posturing of both upper extremities towards his chest lasting for about 10 to 15 minutes.  When EMS arrived patient appeared to be confused, and not speaking.  There was no focal motor deficits however he was code stroke due to his language.  On arrival, patient had return to baseline and appeared to be alert and oriented x3.  We obtain a stat CT head which showed calcified/hemorrhagic  circumferential mass lesion with surrounding edema in the left frontal lobe.  Smaller 5 mm subcortical right frontal lobe hyperdense lesion was also seen.  Was loaded with 1 g Keppra and started on Decadron.  No TPA was given as this likely seizure and patient is back to his baseline.   Past Medical History:  Diagnosis Date  . Angina   . CHF (congestive heart failure) ([place])   . Coronary artery disease   . Headache(784.0) occasional  . Hyperlipemia   . Hypertension   . Myocardial infarction [facility])     Past Surgical History:  Procedure Laterality Date  . CORONARY ARTERY BYPASS GRAFT      Family History  Problem Relation Age of Onset  . Anuerysm Father    Social History:  reports that he quit smoking about 14 years ago. His smoking use included cigarettes. He has a 20.00 pack-year smoking history. He does not have any smokeless tobacco history on file. He reports that he does not drink alcohol or use drugs.  Allergies: No  Known Allergies  Medications:                                                                                                                        I reviewed home medications   ROS:  14 systems reviewed and negative except above    Examination:                                                                                                      General: Appears well-developed and well-nourished.  Psych: Affect appropriate to situation Eyes: No scleral injection HENT: No OP obstrucion, tongue bruised Head: Normocephalic.  Cardiovascular: Normal rate and regular rhythm.  Respiratory: Effort normal and breath sounds normal to anterior ascultation GI: Soft.  No distension. There is no tenderness.  Skin: WDI    Neurological Examination Mental Status: Alert, oriented, thought content appropriate.  Speech fluent without evidence of aphasia. Able to follow 3 step commands without difficulty. Cranial Nerves: II: Visual fields grossly normal,  III,IV, VI: ptosis not present, extra-ocular motions intact bilaterally, pupils equal, round, reactive to light and accommodation V,VII: smile symmetric, facial light touch sensation normal bilaterally VIII: hearing normal bilaterally IX,X: uvula rises symmetrically XI: bilateral shoulder shrug XII: midline tongue extension Motor: Right : Upper extremity   5/5    Left:     Upper extremity   5/5  Lower extremity   5/5     Lower extremity   5/5 Tone and bulk:normal tone throughout; no atrophy noted Sensory: Pinprick and light touch intact throughout, bilaterally Deep Tendon Reflexes: 2+ and symmetric throughout Plantars: Right: downgoing   Left: downgoing Cerebellar: normal finger-to-nose, normal rapid alternating movements and normal heel-to-shin test Gait: normal gait and station     Lab Results: Basic  Metabolic Panel: Recent Labs  Lab 12/20/18 18-Jan-2126 12/20/18 2131  NA 141  --   K 3.6  --   CL 108  --   CO2 20*  --   GLUCOSE 133*  --   BUN 14  --   CREATININE 1.38* 1.20  CALCIUM 9.2  --     CBC: Recent Labs  Lab 12/20/18 2127  WBC 12.8*  NEUTROABS 7.8*  HGB 12.9*  HCT 42.5  MCV 86.9  PLT 252    Coagulation Studies: Recent Labs    12/20/18 18-Jan-2126  LABPROT 13.6  INR 1.1    Imaging: Dg Chest Portable 1 View  Result Date: 12/20/2018 CLINICAL DATA:  Seizure, syncope, history hypertension, coronary artery disease post MI, CHF EXAM: PORTABLE CHEST 1 VIEW COMPARISON:  Portable exam 2210 hours compared to 08/22/2012 FINDINGS: Enlargement of cardiac silhouette post CABG. Mediastinal contours pulmonary vascularity normal. BILATERAL lower lobe consolidation consistent with pneumonia, greater on LEFT. Underlying abnormalities in the LEFT lower lobe are not excluded. Upper lungs clear. Questionable LEFT pleural effusion. No pneumothorax or acute osseous findings. IMPRESSION: BILATERAL lower lobe consolidation consistent with pneumonia LEFT greater than RIGHT with probable associated LEFT pleural effusion. Radiographic follow-up until resolution recommended to exclude underlying abnormalities particularly in the LEFT lower lobe. Electronically Signed   By: Lavonia Dana M.D.   On: 12/20/2018 22:23   Ct Head Code Stroke Wo Contrast  Result Date: 12/20/2018 CLINICAL DATA:  Code stroke. Aphasia. Last seen normal 1-1/2 hours ago. Possible seizure. EXAM: CT HEAD WITHOUT CONTRAST TECHNIQUE: Contiguous axial  images were obtained from the base of the skull through the vertex without intravenous contrast. COMPARISON:  CT head without contrast 08/20/2011 FINDINGS: Brain: A peripherally calcified mass lesion is present along the anterior cranial fossa. This appears to be dural-based measuring 3.2 x 2.0 x 2.8 cm. There is marked surrounding edema effaces the sulci in the left frontal lobe. There is some  mass effect on the frontal horn of the left lateral ventricle is well. Sub falcine herniation is evident. A 5 mm hyperdense lesion is present in the anterior right frontal lobe. This is in the right middle frontal gyrus. There is no significant mass effect from this lesion. No significant white matter disease is present. Basal ganglia are intact. Insert normal brainstem. Vascular: Atherosclerotic calcifications are present within the cavernous internal carotid arteries bilaterally and at the dural margin of the vertebral arteries bilaterally. Skull: Calvarium is intact. No focal lytic or blastic lesions are present. Sinuses/Orbits: The paranasal sinuses and mastoid air cells are clear. The globes and orbits are within normal limits. IMPRESSION: 1. Left frontal cranial fossa peripherally calcified mass lesion measures 3.2 x 2.0 x 2.8 cm. 2. A 5 mm subcortical right frontal lobe hyperdense lesion is present. 3. Although the anterior left frontal lobe lesion appears dural based and may be a meningioma, the presence of 2 lesions suggests the possibility of metastatic disease. Recommend MRI the brain without and with contrast for further evaluation. Electronically Signed   By: San Morelle M.D.   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  Currently patient is back to his baseline.  Code stroke was canceled.  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 Use caution when using heavy equipment or power tools. Avoid working on ladders or at heights. Take showers instead of baths. Ensure the water temperature is not too high on the home water heater. Do not go swimming alone. Do not lock yourself in a room alone (i.e. bathroom). When caring for infants or small children, sit down when holding, feeding, or changing them to minimize risk of injury to the child in the event you have a seizure. Maintain good sleep hygiene. Avoid alcohol.    If Charles Griffith has another seizure, call 911 and bring them back to the ED if:       A.  The seizure lasts longer than 5 minutes.            B.  The patient doesn't wake shortly after the seizure or has new problems such as difficulty seeing, speaking or moving following the seizure       C.  The patient was injured during the seizure       D.  The patient has a temperature over 102 F (39C)       E.  The patient vomited during the seizure and now is having trouble breathing   Charles  Griffith [employer]/support/administrative officer Number [phone]

## 2018-12-20 NOTE — ED Notes (Signed)
ED TO INPATIENT HANDOFF REPORT  ED Nurse Name and Phone #: Tannia Contino [phone]  S Name/Age/Gender Charles Griffith 58 y.o. male Room/Bed: 030C/030C  Code Status   Code Status: Prior  Home/SNF/Other Home Patient oriented to: self, place, time and situation Is this baseline? Yes   Triage Complete: Triage complete  Chief Complaint CODE STROKE    Triage Note No notes on file   Allergies No Known Allergies  Level of Care/Admitting Diagnosis ED Disposition    ED Disposition Condition [place]: [place] [[id number]]  Level of Care: Med-Surg [16]  Diagnosis: Meningioma [facility]) [[id number]]  Admitting Physician: Kristeen Miss [[id number]]  Attending Physician: Kristeen Miss [[id number]]  Estimated length of stay: past midnight tomorrow  Certification:: I certify there are rare and unusual circumstances requiring inpatient admission  Bed request comments: 4np  PT Class (Do Not Modify): Inpatient [101]  PT Acc Code (Do Not Modify): Private [1]       B Medical/Surgery History Past Medical History:  Diagnosis Date  . Angina   . CHF (congestive heart failure) ([place])   . Coronary artery disease   . Headache(784.0) occasional  . Hyperlipemia   . Hypertension   . Myocardial infarction [facility])    Past Surgical History:  Procedure Laterality Date  . CORONARY ARTERY BYPASS GRAFT       A IV Location/Drains/Wounds Patient Lines/Drains/Airways Status   Active Line/Drains/Airways    Name:   Placement date:   Placement time:   Site:   Days:   Peripheral IV Right Hand   -    -    Hand      Peripheral IV 12/20/18 Right Antecubital   12/20/18    2140    Antecubital   less than 1          Intake/Output Last 24 hours  Intake/Output Summary (Last 24 hours) at 12/20/2018 2305 Last data filed at 12/20/2018 2205 Gross per 24 hour  Intake 100 ml  Output -  Net 100 ml    Labs/Imaging Results for orders placed or performed during the hospital encounter of  12/20/18 (from the past 48 hour(s))  Protime-INR     Status: None   Collection Time: 12/20/18  9:27 PM  Result Value Ref Range   Prothrombin Time 13.6 11.4 - 15.2 seconds   INR 1.1 0.8 - 1.2    Comment: (NOTE) INR goal varies based on device and disease states. Performed at [facility], [place] [address], [place], [place] [address]   APTT     Status: None   Collection Time: 12/20/18  9:27 PM  Result Value Ref Range   aPTT 25 24 - 36 seconds    Comment: Performed at [facility] [address] [place], [place] [address]  CBC     Status: Abnormal   Collection Time: 12/20/18  9:27 PM  Result Value Ref Range   WBC 12.8 (H) 4.0 - 10.5 K/uL   RBC 4.89 4.22 - 5.81 MIL/uL   Hemoglobin 12.9 (L) 13.0 - 17.0 g/dL   HCT 42.5 39.0 - 52.0 %   MCV 86.9 80.0 - 100.0 fL   MCH 26.4 26.0 - 34.0 pg   MCHC 30.4 30.0 - 36.0 g/dL   RDW 13.9 11.5 - 15.5 %   Platelets 252 150 - 400 K/uL   nRBC 0.0 0.0 - 0.2 %    Comment: Performed at [facility], [place] [address],  [place], [place] [address]  Differential     Status: Abnormal   Collection Time: 12/20/18  9:27 PM  Result Value Ref Range   Neutrophils Relative % 61 %   Neutro Abs 7.8 (H) 1.7 - 7.7 K/uL   Lymphocytes Relative 25 %   Lymphs Abs 3.2 0.7 - 4.0 K/uL   Monocytes Relative 9 %   Monocytes Absolute 1.2 (H) 0.1 - 1.0 K/uL   Eosinophils Relative 3 %   Eosinophils Absolute 0.4 0.0 - 0.5 K/uL   Basophils Relative 1 %   Basophils Absolute 0.1 0.0 - 0.1 K/uL   Immature Granulocytes 1 %   Abs Immature Granulocytes 0.06 0.00 - 0.07 K/uL    Comment: Performed at [facility] [address], [place], [place] [address]  Comprehensive metabolic panel     Status: Abnormal   Collection Time: 12/20/18  9:27 PM  Result Value Ref Range   Sodium 141 135 - 145 mmol/L   Potassium 3.6 3.5 - 5.1 mmol/L   Chloride 108 98 - 111 mmol/L   CO2 20 (L) 22 - 32 mmol/L   Glucose, Bld 133 (H) 70 - 99 mg/dL   BUN 14 6 - 20 mg/dL    Creatinine, Ser 1.38 (H) 0.61 - 1.24 mg/dL   Calcium 9.2 8.9 - 10.3 mg/dL   Total Protein 7.2 6.5 - 8.1 g/dL   Albumin 4.1 3.5 - 5.0 g/dL   AST 30 15 - 41 U/L   ALT 24 0 - 44 U/L   Alkaline Phosphatase 67 38 - 126 U/L   Total Bilirubin 0.6 0.3 - 1.2 mg/dL   GFR calc non Af Amer 56 (L) >60 mL/min   GFR calc Af Amer >60 >60 mL/min   Anion gap 13 5 - 15    Comment: Performed at [facility] [address] [place], [place] [address]  I-stat Creatinine, ED     Status: None   Collection Time: 12/20/18  9:31 PM  Result Value Ref Range   Creatinine, Ser 1.20 0.61 - 1.24 mg/dL  CBG monitoring, ED     Status: Abnormal   Collection Time: 12/20/18 11:00 PM  Result Value Ref Range   Glucose-Capillary 110 (H) 70 - 99 mg/dL   Dg Chest Portable 1 View  Result Date: 12/20/2018 CLINICAL DATA:  Seizure, syncope, history hypertension, coronary artery disease post MI, CHF EXAM: PORTABLE CHEST 1 VIEW COMPARISON:  Portable exam 2210 hours compared to 08/22/2012 FINDINGS: Enlargement of cardiac silhouette post CABG. Mediastinal contours pulmonary vascularity normal. BILATERAL lower lobe consolidation consistent with pneumonia, greater on LEFT. Underlying abnormalities in the LEFT lower lobe are not excluded. Upper lungs clear. Questionable LEFT pleural effusion. No pneumothorax or acute osseous findings. IMPRESSION: BILATERAL lower lobe consolidation consistent with pneumonia LEFT greater than RIGHT with probable associated LEFT pleural effusion. Radiographic follow-up until resolution recommended to exclude underlying abnormalities particularly in the LEFT lower lobe. Electronically Signed   By: Lavonia Dana M.D.   On: 12/20/2018 22:23   Ct Head Code Stroke Wo Contrast  Result Date: 12/20/2018 CLINICAL DATA:  Code stroke. Aphasia. Last seen normal 1-1/2 hours ago. Possible seizure. EXAM: CT HEAD WITHOUT CONTRAST TECHNIQUE: Contiguous axial images were obtained from the base of the skull through the vertex  without intravenous contrast. COMPARISON:  CT head without contrast 08/20/2011 FINDINGS: Brain: A peripherally calcified mass lesion is present along the anterior cranial fossa. This appears to be dural-based measuring 3.2 x 2.0 x 2.8 cm.  There is marked surrounding edema effaces the sulci in the left frontal lobe. There is some mass effect on the frontal horn of the left lateral ventricle is well. Sub falcine herniation is evident. A 5 mm hyperdense lesion is present in the anterior right frontal lobe. This is in the right middle frontal gyrus. There is no significant mass effect from this lesion. No significant white matter disease is present. Basal ganglia are intact. Insert normal brainstem. Vascular: Atherosclerotic calcifications are present within the cavernous internal carotid arteries bilaterally and at the dural margin of the vertebral arteries bilaterally. Skull: Calvarium is intact. No focal lytic or blastic lesions are present. Sinuses/Orbits: The paranasal sinuses and mastoid air cells are clear. The globes and orbits are within normal limits. IMPRESSION: 1. Left frontal cranial fossa peripherally calcified mass lesion measures 3.2 x 2.0 x 2.8 cm. 2. A 5 mm subcortical right frontal lobe hyperdense lesion is present. 3. Although the anterior left frontal lobe lesion appears dural based and may be a meningioma, the presence of 2 lesions suggests the possibility of metastatic disease. Recommend MRI the brain without and with contrast for further evaluation. Electronically Signed   By: San Morelle M.D.   On: 12/20/2018 22:05    Pending Labs Unresulted Labs (From admission, onward)    Start     Ordered   Signed and Held  HIV antibody (Routine Testing)  Once,   R     Signed and Held          Vitals/Pain Today's Vitals   12/20/18 2152 12/20/18 2153 12/20/18 2215 12/20/18 2230  BP:   101/72 101/61  Pulse:   90 82  Resp:   (!) 22 (!) 26  Temp:      TempSrc:      SpO2:   91% 94%   Weight:  132.2 kg    Height:  5\' 8"  (1.727 m)    PainSc: 0-No pain       Isolation Precautions No active isolations  Medications Medications  sodium chloride flush (NS) 0.9 % injection 3 mL (3 mLs Intravenous Not Given 12/20/18 2151)  levETIRAcetam (KEPPRA) 750 mg in sodium chloride 0.9 % 100 mL IVPB (has no administration in time range)  levETIRAcetam (KEPPRA) IVPB 1000 mg/100 mL premix (0 mg Intravenous Stopped 12/20/18 2205)  dexamethasone (DECADRON) injection 10 mg (10 mg Intravenous Given 12/20/18 2205)    Mobility walks High fall risk   Focused Assessments Neuro Assessment Handoff:  Swallow screen pass? Yes  Cardiac Rhythm: Normal sinus rhythm NIH Stroke Scale ( + Modified Stroke Scale Criteria)  Interval: Initial Level of Consciousness (1a.)   : Alert, keenly responsive LOC Questions (1b. )   +: Answers both questions correctly LOC Commands (1c. )   + : Performs both tasks correctly Best Gaze (2. )  +: Normal Visual (3. )  +: No visual loss Facial Palsy (4. )    : Normal symmetrical movements Motor Arm, Left (5a. )   +: No drift Motor Arm, Right (5b. )   +: No drift Motor Leg, Left (6a. )   +: No drift Motor Leg, Right (6b. )   +: No drift Limb Ataxia (7. ): Absent Sensory (8. )   +: Normal, no sensory loss Best Language (9. )   +: No aphasia Dysarthria (10. ): Normal Extinction/Inattention (11.)   +: No Abnormality Modified SS Total  +: 0 Complete NIHSS TOTAL: 0 Last date known well: 12/20/18 Last time known well:  2020 Neuro Assessment: Exceptions to WDL Neuro Checks:   Initial (12/20/18 2145)  Last Documented NIHSS Modified Score: 0 (12/20/18 2145) Has TPA been given? No If patient is a Neuro Trauma and patient is going to OR before floor call report to [place] nurse: [phone] or [phone]     R Recommendations: See Admitting Provider Note  Report given to:   Additional Notes:

## 2018-12-20 NOTE — H&P (Addendum)
Charles Griffith is an 58 y.o. male.   Chief Complaint: New onset seizure HPI: Patient is a 58 year old right-handed individual who experienced new onset of seizure.  He was a phasic.  He was brought to the emergency department as a code stroke.  A CT scan demonstrates presence of a large left frontal lesion measuring approximately 30 mm in diameter.  There is substantial surrounding vasogenic edema.  There appears to be a second smaller lesion about 5 mm in size in the right frontal region.  This does not have any edema around it.  Patient has no known history of cancer.  He is now being admitted for both seizure control and further work-up including rule out of metastatic disease.  An MRI of the brain will be obtained.  Past Medical History:  Diagnosis Date  . Angina   . CHF (congestive heart failure) ([place])   . Coronary artery disease   . Headache(784.0) occasional  . Hyperlipemia   . Hypertension   . Myocardial infarction [facility])     Past Surgical History:  Procedure Laterality Date  . CORONARY ARTERY BYPASS GRAFT      Family History  Problem Relation Age of Onset  . Anuerysm Father    Social History:  reports that he quit smoking about 14 years ago. His smoking use included cigarettes. He has a 20.00 pack-year smoking history. He does not have any smokeless tobacco history on file. He reports that he does not drink alcohol or use drugs.  Allergies: No Known Allergies  (Not in a hospital admission)   Results for orders placed or performed during the hospital encounter of 12/20/18 (from the past 48 hour(s))  Protime-INR     Status: None   Collection Time: 12/20/18  9:27 PM  Result Value Ref Range   Prothrombin Time 13.6 11.4 - 15.2 seconds   INR 1.1 0.8 - 1.2    Comment: (NOTE) INR goal varies based on device and disease states. Performed at [facility], [place] [address] [place], [place] [address]   APTT     Status: None   Collection Time: 12/20/18  9:27 PM  Result Value  Ref Range   aPTT 25 24 - 36 seconds    Comment: Performed at [facility] [address], [place], [place] [address]  CBC     Status: Abnormal   Collection Time: 12/20/18  9:27 PM  Result Value Ref Range   WBC 12.8 (H) 4.0 - 10.5 K/uL   RBC 4.89 4.22 - 5.81 MIL/uL   Hemoglobin 12.9 (L) 13.0 - 17.0 g/dL   HCT 42.5 39.0 - 52.0 %   MCV 86.9 80.0 - 100.0 fL   MCH 26.4 26.0 - 34.0 pg   MCHC 30.4 30.0 - 36.0 g/dL   RDW 13.9 11.5 - 15.5 %   Platelets 252 150 - 400 K/uL   nRBC 0.0 0.0 - 0.2 %    Comment: Performed at [facility], [place] [address] [place], [place] [address]  Differential     Status: Abnormal   Collection Time: 12/20/18  9:27 PM  Result Value Ref Range   Neutrophils Relative % 61 %   Neutro Abs 7.8 (H) 1.7 - 7.7 K/uL   Lymphocytes Relative 25 %   Lymphs Abs 3.2 0.7 - 4.0 K/uL   Monocytes Relative 9 %   Monocytes Absolute 1.2 (H) 0.1 - 1.0 K/uL   Eosinophils Relative 3 %   Eosinophils Absolute 0.4 0.0 -  0.5 K/uL   Basophils Relative 1 %   Basophils Absolute 0.1 0.0 - 0.1 K/uL   Immature Granulocytes 1 %   Abs Immature Granulocytes 0.06 0.00 - 0.07 K/uL    Comment: Performed at [facility], [place] [address], [place], [place] [address]  Comprehensive metabolic panel     Status: Abnormal   Collection Time: 12/20/18  9:27 PM  Result Value Ref Range   Sodium 141 135 - 145 mmol/L   Potassium 3.6 3.5 - 5.1 mmol/L   Chloride 108 98 - 111 mmol/L   CO2 20 (L) 22 - 32 mmol/L   Glucose, Bld 133 (H) 70 - 99 mg/dL   BUN 14 6 - 20 mg/dL   Creatinine, Ser 1.38 (H) 0.61 - 1.24 mg/dL   Calcium 9.2 8.9 - 10.3 mg/dL   Total Protein 7.2 6.5 - 8.1 g/dL   Albumin 4.1 3.5 - 5.0 g/dL   AST 30 15 - 41 U/L   ALT 24 0 - 44 U/L   Alkaline Phosphatase 67 38 - 126 U/L   Total Bilirubin 0.6 0.3 - 1.2 mg/dL   GFR calc non Af Amer 56 (L) >60 mL/min   GFR calc Af Amer >60 >60 mL/min   Anion gap 13 5 - 15    Comment: Performed at [facility] [address],  [place], [place] [address]  I-stat Creatinine, ED     Status: None   Collection Time: 12/20/18  9:31 PM  Result Value Ref Range   Creatinine, Ser 1.20 0.61 - 1.24 mg/dL   Dg Chest Portable 1 View  Result Date: 12/20/2018 CLINICAL DATA:  Seizure, syncope, history hypertension, coronary artery disease post MI, CHF EXAM: PORTABLE CHEST 1 VIEW COMPARISON:  Portable exam 2210 hours compared to 08/22/2012 FINDINGS: Enlargement of cardiac silhouette post CABG. Mediastinal contours pulmonary vascularity normal. BILATERAL lower lobe consolidation consistent with pneumonia, greater on LEFT. Underlying abnormalities in the LEFT lower lobe are not excluded. Upper lungs clear. Questionable LEFT pleural effusion. No pneumothorax or acute osseous findings. IMPRESSION: BILATERAL lower lobe consolidation consistent with pneumonia LEFT greater than RIGHT with probable associated LEFT pleural effusion. Radiographic follow-up until resolution recommended to exclude underlying abnormalities particularly in the LEFT lower lobe. Electronically Signed   By: Lavonia Dana M.D.   On: 12/20/2018 22:23   Ct Head Code Stroke Wo Contrast  Result Date: 12/20/2018 CLINICAL DATA:  Code stroke. Aphasia. Last seen normal 1-1/2 hours ago. Possible seizure. EXAM: CT HEAD WITHOUT CONTRAST TECHNIQUE: Contiguous axial images were obtained from the base of the skull through the vertex without intravenous contrast. COMPARISON:  CT head without contrast 08/20/2011 FINDINGS: Brain: A peripherally calcified mass lesion is present along the anterior cranial fossa. This appears to be dural-based measuring 3.2 x 2.0 x 2.8 cm. There is marked surrounding edema effaces the sulci in the left frontal lobe. There is some mass effect on the frontal horn of the left lateral ventricle is well. Sub falcine herniation is evident. A 5 mm hyperdense lesion is present in the anterior right frontal lobe. This is in the right middle frontal gyrus. There is no significant  mass effect from this lesion. No significant white matter disease is present. Basal ganglia are intact. Insert normal brainstem. Vascular: Atherosclerotic calcifications are present within the cavernous internal carotid arteries bilaterally and at the dural margin of the vertebral arteries bilaterally. Skull: Calvarium is intact. No focal lytic or blastic lesions are present. Sinuses/Orbits: The paranasal sinuses  and mastoid air cells are clear. The globes and orbits are within normal limits. IMPRESSION: 1. Left frontal cranial fossa peripherally calcified mass lesion measures 3.2 x 2.0 x 2.8 cm. 2. A 5 mm subcortical right frontal lobe hyperdense lesion is present. 3. Although the anterior left frontal lobe lesion appears dural based and may be a meningioma, the presence of 2 lesions suggests the possibility of metastatic disease. Recommend MRI the brain without and with contrast for further evaluation. Electronically Signed   By: San Morelle M.D.   On: 12/20/2018 22:05    Review of Systems  Constitutional: Negative.   Respiratory: Negative.   Cardiovascular:       History of coronary artery disease status post CABG 2011.  History of arrhythmia 2012.  On amiodarone  Gastrointestinal: Negative.   Genitourinary: Negative.   Musculoskeletal: Negative.   Skin: Negative.   Neurological:       Recent history of headaches over the past several weeks  Psychiatric/Behavioral: Negative.     Blood pressure 124/65, pulse 95, temperature 98.1 F (36.7 C), temperature source Oral, resp. rate 14, height 5\' 8"  (1.727 m), weight 132.2 kg, SpO2 91 %. Physical Exam  Constitutional: He is oriented to person, place, and time. He appears well-developed and well-nourished.  HENT:  Head: Normocephalic and atraumatic.  Eyes: Pupils are equal, round, and reactive to light. Conjunctivae and EOM are normal.  Neck: Normal range of motion. Neck supple.  Cardiovascular: Normal rate and regular rhythm.   Respiratory: Effort normal and breath sounds normal.  GI: Soft. Bowel sounds are normal.  Musculoskeletal: Normal range of motion.  Neurological: He is alert and oriented to person, place, and time.  Immediately after seizure patient was noted to have vomited and during the seizure he lost control of his bladder.  Currently he is awake and alert and does not recollect any of those abnormalities.  Pupils are 3 mm equally brisk and reactive extraocular movements are full and the face is symmetric.  There is no evidence of a cortical drift.  Deep tendon reflexes are 2+ in the biceps triceps 2+ in the patella and the Achilles.  Babinski reflexes are equivocal bilaterally.  Skin: Skin is warm and dry.  Psychiatric: He has a normal mood and affect. His behavior is normal. Judgment and thought content normal.     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  Plan: MRI of the brain with and without contrast if this is consistent with a meningioma he will likely need surgical resection of the left frontal lobe lesion.  Otherwise metastatic work-up will ensue.  Earleen Newport, MD 12/20/2018, 10:26 PM

## 2018-12-20 NOTE — ED Provider Notes (Signed)
[facility] EMERGENCY DEPARTMENT Provider Note   CSN: [id number] Arrival date & time: 12/20/18  2121    History   Chief Complaint Chief Complaint  Patient presents with  . Code Stroke  . Seizures    HPI Charles Griffith is a 58 y.o. male.     HPI 58 year old male history of coronary artery disease, CHF, presents tonight after seizure at home.  Wife describes him turning to his head and having D corticate posturing.  This lasted approximately 15 minutes.  He was post ictal.  He was transported via EMS with initial presentation as code stroke.  He was sent directly to the CT scanner.  Dr. Dr. Lorraine Lax saw and evaluated him prior to my evaluation.  Reported to me is that his blood sugar was normal and he has no prior history of seizures.  Patient denies any headache, injury, weight loss, chest pain, shortness of breath, or history of cancer. Past Medical History:  Diagnosis Date  . Angina   . CHF (congestive heart failure) ([place])   . Coronary artery disease   . Headache(784.0) occasional  . Hyperlipemia   . Hypertension   . Myocardial infarction ([place])     There are no active problems to display for this patient.   Past Surgical History:  Procedure Laterality Date  . CORONARY ARTERY BYPASS GRAFT          Home Medications    Prior to Admission medications   Medication Sig Start Date End Date Taking? Authorizing Provider  amiodarone (PACERONE) 200 MG tablet Take 1 tablet (200 mg total) by mouth daily. 08/19/11 08/18/12  Dixie Dials, MD  amiodarone (PACERONE) 200 MG tablet Take 200 mg by mouth daily.    [provider]  aspirin 325 MG tablet Take 325 mg by mouth daily.      [provider]  B Complex Vitamins (B COMPLEX PO) Take 1 tablet by mouth once a week. Takes on sundays     [provider]  CALCIUM PO Take 1 tablet by mouth See admin instructions. Patient takes Monday thru Friday only    [provider]  docusate  sodium (COLACE) 100 MG capsule Take 200 mg by mouth daily.    [provider]  ferrous sulfate 325 (65 FE) MG EC tablet Take 325 mg by mouth See admin instructions. On Sat and Sun    [provider]  fish oil-omega-3 fatty acids 1000 MG capsule Take 2 g by mouth daily.    [provider]  ibuprofen (ADVIL,MOTRIN) 200 MG tablet Take 400 mg by mouth every 6 (six) hours as needed. For pain    [provider]  metoprolol tartrate (LOPRESSOR) 25 MG tablet Take 25 mg by mouth 2 (two) times daily.      [provider]  niacin (NIASPAN) 500 MG CR tablet Take 500 mg by mouth daily.      [provider]  nitroGLYCERIN (NITROSTAT) 0.4 MG SL tablet Place 1 tablet (0.4 mg total) under the tongue every 5 (five) minutes x 3 doses as needed for chest pain. 08/23/12   Dixie Dials, MD  pravastatin (PRAVACHOL) 40 MG tablet Take 40 mg by mouth daily.      [provider]    Family History Family History  Problem Relation Age of Onset  . Anuerysm Father     Social History Social History   Tobacco Use  . Smoking status: Former Smoker    Packs/day: 1.00  Years: 20.00    Pack years: 20.00    Types: Cigarettes    Last attempt to quit: 08/17/2004    Years since quitting: 14.3  Substance Use Topics  . Alcohol use: No  . Drug use: No     Allergies   Patient has no known allergies.   Review of Systems Review of Systems  All other systems reviewed and are negative.    Physical Exam Updated Vital Signs Ht 1.727 m (5\' 8" )   Wt 132.2 kg   BMI 44.31 kg/m   Physical Exam Vitals signs and nursing note reviewed.  Constitutional:      Appearance: Normal appearance. He is obese.  HENT:     Head: Normocephalic and atraumatic.     Right Ear: External ear normal.     Left Ear: External ear normal.     Nose: Nose normal.     Mouth/Throat:     Comments: Multiple contusions to tongue Eyes:     Extraocular Movements: Extraocular  movements intact.     Pupils: Pupils are equal, round, and reactive to light.  Neck:     Musculoskeletal: Normal range of motion.  Cardiovascular:     Rate and Rhythm: Normal rate and regular rhythm.  Pulmonary:     Effort: Pulmonary effort is normal.     Breath sounds: Normal breath sounds.  Abdominal:     General: Bowel sounds are normal. There is distension.     Palpations: Abdomen is soft.  Musculoskeletal: Normal range of motion.  Skin:    General: Skin is warm and dry.     Capillary Refill: Capillary refill takes less than 2 seconds.  Neurological:     General: No focal deficit present.     Mental Status: He is alert and oriented to person, place, and time. Mental status is at baseline.     Cranial Nerves: No cranial nerve deficit.     Sensory: No sensory deficit.     Motor: No weakness.     Deep Tendon Reflexes: Reflexes normal.  Psychiatric:        Mood and Affect: Mood normal.      ED Treatments / Results  Labs (all labs ordered are listed, but only abnormal results are displayed) Labs Reviewed  CBC - Abnormal; Notable for the following components:      Result Value   WBC 12.8 (*)    Hemoglobin 12.9 (*)    All other components within normal limits  DIFFERENTIAL - Abnormal; Notable for the following components:   Neutro Abs 7.8 (*)    Monocytes Absolute 1.2 (*)    All other components within normal limits  PROTIME-INR  APTT  COMPREHENSIVE METABOLIC PANEL  I-STAT CREATININE, ED  I-STAT CREATININE, ED  CBG MONITORING, ED   Labs reviewed Mild leukoctysis noted- ? Due to demargination with seizure- low index of suspicion for infection  EKG EKG Interpretation  Date/Time:  Friday December 20 2018 22:56:48 EDT Ventricular Rate:  83 PR Interval:    QRS Duration: 156 QT Interval:  419 QTC Calculation: 493 R Axis:   -48 Text Interpretation:  Normal sinus rhythm Premature ventricular complexes Left bundle branch block Confirmed by Pattricia Boss [phone]) on  12/20/2018 11:04:22 PM   Radiology No results found.  Procedures .Critical Care Performed by: Pattricia Boss, MD Authorized by: Pattricia Boss, MD   Critical care provider statement:    Critical care time (minutes):  45   Critical care end time:  12/20/2018 11:06  PM   Critical care was necessary to treat or prevent imminent or life-threatening deterioration of the following conditions:  CNS failure or compromise   Critical care was time spent personally by me on the following activities:  Discussions with consultants, evaluation of patient's response to treatment, examination of patient, ordering and performing treatments and interventions, ordering and review of laboratory studies, ordering and review of radiographic studies, pulse oximetry, re-evaluation of patient's condition, obtaining history from patient or surrogate and review of old charts   (including critical care time)  Medications Ordered in ED Medications  sodium chloride flush (NS) 0.9 % injection 3 mL (3 mLs Intravenous Not Given 12/20/18 2151)  levETIRAcetam (KEPPRA) IVPB 1000 mg/100 mL premix (1,000 mg Intravenous New Bag/Given 12/20/18 2149)  levETIRAcetam (KEPPRA) 750 mg in sodium chloride 0.9 % 100 mL IVPB (has no administration in time range)  dexamethasone (DECADRON) injection 10 mg (has no administration in time range)     Initial Impression / Assessment and Plan / ED Course  I have reviewed the triage vital signs and the nursing notes.  Pertinent labs & imaging results that were available during my care of the patient were reviewed by me and considered in my medical decision making (see chart for details).    Discussed care with Dr. Lorraine Lax Reviewed CT of head with Dr. Lorraine Lax. Discussed with Dr. Ellene Route and he will admit DDX Meningioma Other primary brain cancer Metastatic disease Patient received IV keppra and decadron NO further seizures and patient at baseline without neuro deficit      Final Clinical  Impressions(s) / ED Diagnoses   Final diagnoses:  Brain mass  New onset seizure [facility])    ED Discharge Orders    None       Pattricia Boss, MD 12/20/18 2309

## 2018-12-21 ENCOUNTER — Encounter (HOSPITAL_COMMUNITY): Payer: Self-pay | Admitting: *Deleted

## 2018-12-21 ENCOUNTER — Inpatient Hospital Stay (HOSPITAL_COMMUNITY): Payer: BLUE CROSS/BLUE SHIELD

## 2018-12-21 DIAGNOSIS — E785 Hyperlipidemia, unspecified: Secondary | ICD-10-CM

## 2018-12-21 DIAGNOSIS — I10 Essential (primary) hypertension: Secondary | ICD-10-CM

## 2018-12-21 DIAGNOSIS — I25811 Atherosclerosis of native coronary artery of transplanted heart without angina pectoris: Secondary | ICD-10-CM

## 2018-12-21 LAB — URINALYSIS, ROUTINE W REFLEX MICROSCOPIC
Bilirubin Urine: NEGATIVE
Glucose, UA: NEGATIVE mg/dL
Hgb urine dipstick: NEGATIVE
Ketones, ur: NEGATIVE mg/dL
Leukocytes,Ua: NEGATIVE
Nitrite: NEGATIVE
Protein, ur: NEGATIVE mg/dL
Specific Gravity, Urine: 1.04 — ABNORMAL HIGH (ref 1.005–1.030)
pH: 5 (ref 5.0–8.0)

## 2018-12-21 LAB — HIV ANTIBODY (ROUTINE TESTING W REFLEX): HIV Screen 4th Generation wRfx: NONREACTIVE

## 2018-12-21 LAB — MRSA PCR SCREENING: MRSA by PCR: NEGATIVE

## 2018-12-21 MED ORDER — TAMSULOSIN HCL 0.4 MG PO CAPS
0.4000 mg | ORAL_CAPSULE | Freq: Every day | ORAL | Status: DC
  Administered 2018-12-22: 0.4 mg via ORAL
  Filled 2018-12-21: qty 1

## 2018-12-21 MED ORDER — ENOXAPARIN SODIUM 40 MG/0.4ML ~~LOC~~ SOLN
40.0000 mg | SUBCUTANEOUS | Status: DC
  Administered 2018-12-21: 40 mg via SUBCUTANEOUS
  Filled 2018-12-21: qty 0.4

## 2018-12-21 MED ORDER — PANTOPRAZOLE SODIUM 40 MG PO TBEC
40.0000 mg | DELAYED_RELEASE_TABLET | Freq: Every day | ORAL | Status: DC
  Administered 2018-12-22: 40 mg via ORAL
  Filled 2018-12-21: qty 1

## 2018-12-21 MED ORDER — AMIODARONE HCL 200 MG PO TABS
200.0000 mg | ORAL_TABLET | Freq: Every day | ORAL | Status: DC
  Administered 2018-12-21: 200 mg via ORAL
  Filled 2018-12-21: qty 1

## 2018-12-21 MED ORDER — DEXAMETHASONE SODIUM PHOSPHATE 4 MG/ML IJ SOLN
4.0000 mg | Freq: Four times a day (QID) | INTRAMUSCULAR | Status: DC
  Administered 2018-12-21 – 2018-12-22 (×6): 4 mg via INTRAVENOUS
  Filled 2018-12-21 (×6): qty 1

## 2018-12-21 MED ORDER — IOHEXOL 300 MG/ML  SOLN
100.0000 mL | Freq: Once | INTRAMUSCULAR | Status: AC | PRN
  Administered 2018-12-21: 100 mL via INTRAVENOUS

## 2018-12-21 MED ORDER — GADOBUTROL 1 MMOL/ML IV SOLN
10.0000 mL | Freq: Once | INTRAVENOUS | Status: AC | PRN
  Administered 2018-12-21: 10 mL via INTRAVENOUS

## 2018-12-21 NOTE — Progress Notes (Signed)
   12/21/18 0900  Clinical Encounter Type  Visited With Patient and family together  Visit Type Initial;ED;Other (Comment) (Stroke)  Referral From Other (Comment) (family clergy)  Spiritual Encounters  Spiritual Needs Prayer;Emotional  Stress Factors  Patient Stress Factors Loss of control;Health changes  Family Stress Factors Loss of control   Present w/ family in ED.  Compassionate presence, prayed w/ family.  Chaplain remains available.  Myra Gianotti resident, [phone]

## 2018-12-21 NOTE — Plan of Care (Signed)
Discussed with patient and wife plan of care for the evening, pain management, activity, condom cath for the first 12 hours and MRI pending with some teach back displayed

## 2018-12-21 NOTE — Consult Note (Addendum)
Charles Griffith PNT:[id number] DOB: 02-25-1961 DOA: 12/20/2018  PCP: Denita Lung, MD  REQUESTING PHYSICIAN: DR Ellene Route REASON: ASSISTANCE WITH MED MANAGEMENT, MALIGNANCY WORKUP  Chief Complaint: currently none, prseented initially with seizure  HPI:  80yom PM CAD prestend with new seizure. Tx as code stroke, saw neurology, normal CBG, imaging revealed large left frontal lesion measuring approximately 30 mm in diameter.  There is substantial surrounding vasogenic edema.  There appears to be a second smaller lesion about 5 mm in size in the right frontal region.  Admitted by neurosurgery for seizure control and w/u metastatic disease.  Patient feels well now and has no complaints. He reports CAD has been stable with no recent CP, he continues to take meds for HTN and hyperlipidemia which he feels are stable.  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  Past Medical History:  Diagnosis Date  . Angina   . CHF (congestive heart failure) ([place])   . Coronary artery disease   . Headache(784.0) occasional  . Hyperlipemia   . Hypertension   . Myocardial infarction [facility])     Past Surgical History:  Procedure Laterality Date  . CORONARY ARTERY BYPASS GRAFT    . TOOTH EXTRACTION       reports that he quit smoking about 14 years ago. His smoking use included cigarettes. He has a 20.00 pack-year smoking history. He has never used smokeless tobacco. He reports that he does not drink alcohol or use drugs. Mobility: ambulatory   No Known Allergies  Family History  Problem Relation Age of Onset  . Anuerysm Father      Prior to Admission medications   Medication Sig Start Date End Date Taking? Authorizing  Provider  amiodarone (PACERONE) 200 MG tablet Take 1 tablet (200 mg total) by mouth daily. Patient taking differently: Take 200 mg by mouth at bedtime.  08/19/11 12/20/26 Yes Dixie Dials, MD  amLODipine (NORVASC) 5 MG tablet Take 5 mg by mouth daily.   Yes [provider]  aspirin 325 MG tablet Take 325 mg by mouth daily.     Yes [provider]  atorvastatin (LIPITOR) 40 MG tablet Take 40 mg by mouth daily at 6 PM.   Yes [provider]  b complex vitamins tablet Take 1 tablet by mouth daily.   Yes [provider]  calcium carbonate (OSCAL) 1500 (600 Ca) MG TABS tablet Take 600 mg of elemental calcium by mouth daily with breakfast.   Yes [provider]  docusate sodium (COLACE) 50 MG capsule Take 50 mg by mouth daily.   Yes [provider]  ferrous sulfate 325 (65 FE) MG EC tablet Take 325 mg by mouth daily with breakfast.    Yes [provider]  losartan (COZAAR) 50 MG tablet Take 50 mg by mouth daily.   Yes [provider]  metoprolol tartrate (LOPRESSOR) 25 MG tablet Take 25 mg by mouth 2 (two) times daily.     Yes [provider]  niacin (NIASPAN) 500 MG CR tablet Take 500 mg by mouth at bedtime.    Yes [provider]  nitroGLYCERIN (NITROSTAT) 0.4 MG SL tablet Place 1 tablet (0.4 mg total) under the tongue every  5 (five) minutes x 3 doses as needed for chest pain. 08/23/12  Yes Dixie Dials, MD  Omega-3 Fatty Acids (OMEGA 3 500) 500 MG CAPS Take 500 mg by mouth daily.   Yes [provider]  omeprazole (PRILOSEC) 20 MG capsule Take 20 mg by mouth daily.   Yes [provider]  tamsulosin (FLOMAX) 0.4 MG CAPS capsule Take 0.4 mg by mouth daily.   Yes [provider]    Physical Exam: Vitals:   12/21/18 0721 12/21/18 1052  BP: 116/66   Pulse: 73   Resp: (!) 23   Temp: 97.7 F (36.5 C) 97.8 F (36.6 C)  SpO2: 95%     Constitutional:   . Appears calm and comfortable  Eyes:  . pupils and irises appear normal . Normal lids  ENMT:  . grossly normal hearing  . Lips appear normal Neck:  . neck appears normal, no masses . no thyromegaly Respiratory:  . CTA bilaterally, no w/r/r.  . Respiratory effort normal.  Cardiovascular:  . RRR, no m/r/g . No LE extremity edema   Abdomen:  . Abdomen appears normal; no tenderness or masses Musculoskeletal:  . Digits/nails BUE: no clubbing, cyanosis, petechiae, infection . exam of joints, bones, muscles of at least one of following: head/neck, RUE, LUE o strength and tone normal, no atrophy, no abnormal movements o No tenderness, masses Skin:  . No rashes, lesions, ulcers . palpation of skin: no induration or nodules Psychiatric:  . Mental status o Mood, affect appropriate . judgment and insight appear intact    I have personally reviewed following labs and imaging studies  Labs:  CMP noted CBC noted  Imaging studies:   MRI noted   Medical tests:   EKG independently reviewed: SR LBBB old    Active Problems:   Meningioma (HCC)   Assessment/Plan L frontal lobe mass, second right frontal lobe mass, other lesions suspicious for masses --brain management per Dr. 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 Will s/o, please  call for questions. I did d/w recs with Dr. Ellene Route.  Time spent: 50 minutes  Murray Hodgkins, MD  [facility] Direct contact: see [email or web address]  7PM-7AM contact night coverage as below   1. Check the care team in [facility] and look for a) attending/consulting [facility] provider listed and b) the [facility] team listed 2. Log into [email or web address] and use [place] universal password to access. If you do not have the password, please contact the hospital operator. 3. Locate the [facility] provider you are looking for under [facility] and page to a number that you can be directly reached. 4. If you still have difficulty reaching the provider, please page the [facility] (Director on Call) for the Hospitalists listed on amion for assistance.   12/21/2018, 11:12 AM

## 2018-12-21 NOTE — Progress Notes (Signed)
Patient ID: Charles Griffith, male   DOB: October 21, 1960, 58 y.o.   MRN: [id number] CT of the chest shows a large lesion in the left lung base that appears to be pleural-based.  It measures approximately 10 x 15 cm.  I described this to the patient and his wife Contacted Dr. Dahlia Byes regarding how best to obtain tissue.  Dr. Darcey Nora suggested that a direct needle biopsy could be performed by interventional radiology.  If that were not to be successful then he could do a bronchoscopy to obtain tissue.  This would certainly be easier than going the route of a craniotomy.  I did however note to the patient that at some point he would need to have the large lesion in the left frontal lobe resected but obtaining a tissue diagnosis of what this most likely is would be of great benefit.  He will be seen by Dr. Darcey Nora in the near future to discuss those aspects of his care.

## 2018-12-21 NOTE — Progress Notes (Signed)
Reviewed MRI Brain, appears to have metastatic cancer.  Continue recommendations as listed in consult note. Please call back with questions.  F/u with neurology outpatient

## 2018-12-21 NOTE — Progress Notes (Signed)
Patient ID: Charles Griffith, male   DOB: August 19, 1961, 58 y.o.   MRN: [id number] MRI performed early this morning.  Study demonstrates 2 separate lesions.  Osseous lesions are suggested but I am not seeing these on the MRI.  Left frontal lesion has significant vasogenic edema.  I discussed the findings with the patient and his wife.  Before we endeavor to remove the cancerous lesion I believe he should have a basic metastatic work-up.  I will order a CT of the chest abdomen and pelvis I will request the hospitalist to see him to help with this endeavor.

## 2018-12-22 LAB — PROTIME-INR
INR: 1.1 (ref 0.8–1.2)
Prothrombin Time: 13.7 seconds (ref 11.4–15.2)

## 2018-12-22 LAB — APTT: aPTT: 27 seconds (ref 24–36)

## 2018-12-22 MED ORDER — DEXAMETHASONE 2 MG PO TABS: ORAL_TABLET | ORAL | 3 refills | Status: DC

## 2018-12-22 MED ORDER — DEXAMETHASONE 2 MG PO TABS: ORAL_TABLET | ORAL | 3 refills | Status: AC

## 2018-12-22 MED ORDER — LEVETIRACETAM 500 MG PO TABS: 500.0000 mg | ORAL_TABLET | Freq: Two times a day (BID) | ORAL | 5 refills | Status: AC

## 2018-12-22 MED ORDER — LEVETIRACETAM 500 MG PO TABS: 500.0000 mg | ORAL_TABLET | Freq: Two times a day (BID) | ORAL | 5 refills | Status: DC

## 2018-12-22 NOTE — Discharge Summary (Signed)
Physician Discharge Summary  Patient ID: Charles Griffith MRN: [id number] DOB/AGE: [birth year] 58 y.o.  Admit date: 12/20/2018 Discharge date: 12/22/2018  Admission Diagnoses: Seizure disorder, new onset.  Left frontal brain mass.  Discharge Diagnoses: Seizure disorder, new onset.  Left frontal brain mass.  Left pulmonary mass.  Metastatic carcinoma. Active Problems:   Meningioma [facility])   Discharged Condition: fair  Hospital Course: Patient was admitted from the emergency room having had a witnessed seizure at home.  A CT scan of the brain demonstrated presence of a large left frontal mass with substantial peritumoral edema.  The patient was started on IV Keppra.  He was also started on Decadron.  An MRI was subsequently performed which demonstrates that the mass is not a solitary lesion there is a small 5 mm mass in the opposite side of his head.  The mass appears to be durally based but is not completely consistent with a meningioma.  A metastatic work-up ensued including a CT of the chest abdomen pelvis.  CT of the chest shows a pleural-based mass in the left lower lobe.  There is also some other small masses with nodularities noted to be about 5 mm in the lungs.  The abdominal CT is negative.  I consulted thoracic surgery and it was suggested that a direct needle biopsy may be considered for the pleural-based left lower lobe mass this was to be done by interventional radiology.  About this time I was informed that the patient's insurance is to be done by [place] or [facility] if it is not emergent.  Therefore the patient being awake alert and stable is discharged on Keppra 500 mg twice a day and Decadron 4 mg twice a day.  I have advised that he should follow-up with his primary care physician to be plugged into the [place] system.  I have also contacted the nurse case manager to help with these arrangements.  He will likely require craniotomy for resection of the lesion in the  left frontal lobe at some point.  He will also require further oncologic treatment for what this mass represents which will likely be determined by biopsy.  Consults: Thoracic surgery, hospitalist service  Significant Diagnostic Studies: CT of the chest abdomen and pelvis.  Treatments: Medical treatment with steroids and Keppra  Discharge Exam: Blood pressure 115/82, pulse 67, temperature 98 F (36.7 C), temperature source Oral, resp. rate (!) 22, height 5\' 8"  (1.727 m), weight 131.6 kg, SpO2 94 %. Patient is alert and awake Station and gait are intact cranial nerve examination is intact there is no evidence of a cortical drift.  Disposition: Discharge disposition: 01-Home or Self Care       Discharge Instructions    Diet - low sodium heart healthy   Complete by:  As directed    Increase activity slowly   Complete by:  As directed      Allergies as of 12/22/2018   No Known Allergies     Medication List    TAKE these medications   amiodarone 200 MG tablet Commonly known as:  Pacerone Take 1 tablet (200 mg total) by mouth daily. What changed:  when to take this   amLODipine 5 MG tablet Commonly known as:  NORVASC Take 5 mg by mouth daily.   aspirin 325 MG tablet Take 325 mg by mouth daily.   atorvastatin 40 MG tablet Commonly known as:  LIPITOR Take 40 mg by mouth daily at 6 PM.  b complex vitamins tablet Take 1 tablet by mouth daily.   calcium carbonate 1500 (600 Ca) MG Tabs tablet Commonly known as:  OSCAL Take 600 mg of elemental calcium by mouth daily with breakfast.   dexamethasone 2 MG tablet Commonly known as:  DECADRON Two tablets twice daily   docusate sodium 50 MG capsule Commonly known as:  COLACE Take 50 mg by mouth daily.   ferrous sulfate 325 (65 FE) MG EC tablet Take 325 mg by mouth daily with breakfast.   levETIRAcetam 500 MG tablet Commonly known as:  Keppra Take 1 tablet (500 mg total) by mouth 2 (two) times daily.   losartan 50  MG tablet Commonly known as:  COZAAR Take 50 mg by mouth daily.   metoprolol tartrate 25 MG tablet Commonly known as:  LOPRESSOR Take 25 mg by mouth 2 (two) times daily.   niacin 500 MG CR tablet Commonly known as:  NIASPAN Take 500 mg by mouth at bedtime.   nitroGLYCERIN 0.4 MG SL tablet Commonly known as:  NITROSTAT Place 1 tablet (0.4 mg total) under the tongue every 5 (five) minutes x 3 doses as needed for chest pain.   Omega 3 500 500 MG Caps Take 500 mg by mouth daily.   omeprazole 20 MG capsule Commonly known as:  PRILOSEC Take 20 mg by mouth daily.   tamsulosin 0.4 MG Caps capsule Commonly known as:  FLOMAX Take 0.4 mg by mouth daily.        Signed: Blanchie Dessert Vernal Hritz 12/22/2018, 11:20 AM

## 2018-12-22 NOTE — Progress Notes (Signed)
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 Screening CT scan of chest and abdomen shows a 10 cm mass replacing most of the left lower lobe without discrete enlarged mediastinal nodes.  There is a small 8 mm left upper lobe round nodule.  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 Last chest x-ray in our system is 2015 at which time the left lower lobe mass was not present.  Patient had some atypical chest pain last month and was evaluated at the ED at [facility].  He was told he had some pleural or pulmonary fluid and was discharged. He has not had echo or stress test since his CABG in 2011  and has had no recurrent symptoms of angina.  Patient denies weight loss shortness of breath hemoptysis fever but has had some chest wall discomfort. Objective: Vital signs in last 24 hours: Temp:  [97.7 F (36.5 C)-98 F (36.7 C)] 98 F (36.7 C) (03/22 0812) Pulse Rate:  [50-76] 67 (03/22 5053) Cardiac Rhythm: Sinus bradycardia;Bundle branch block;Heart block (03/22 0701) Resp:  [18-23] 22 (03/22 0812) BP: (111-118)/(70-82) 115/82 (03/22 0812) SpO2:  [94 %-96 %] 94 % (03/22 0812)  Hemodynamic parameters for last 24 hours:  Sinus rhythm Intake/Output from previous day: 03/21 0701 - 03/22 0700 In: 1402 [P.O.:1302; IV Piggyback:100] Out: 550 [Urine:550] Intake/Output this shift: Total I/O In: 350 [P.O.:240; IV Piggyback:110] Out: -        Exam    General- alert and comfortable, no headache or vision complaints    Neck- no JVD, no cervical adenopathy palpable, no carotid bruit   Lungs- clear without rales, wheezes.  Well-healed sternal incision.   Cor- regular rate and rhythm, no murmur , gallop   Abdomen- soft, non-tender   Extremities - warm, non-tender, minimal edema   Neuro- oriented,  appropriate, no focal weakness   Lab Results: Recent Labs    12/20/18 2127  WBC 12.8*  HGB 12.9*  HCT 42.5  PLT 252   BMET:  Recent Labs    12/20/18 2127 12/20/18 2131  NA 141  --   K 3.6  --   CL 108  --   CO2 20*  --   GLUCOSE 133*  --   BUN 14  --   CREATININE 1.38* 1.20  CALCIUM 9.2  --     PT/INR:  Recent Labs    12/22/18 0410  LABPROT 13.7  INR 1.1   ABG    Component Value Date/Time   PHART 7.288 (L) 06/21/2010 2126   HCO3 21.7 06/21/2010 2126   TCO2 26 08/17/2011 2120   ACIDBASEDEF 5.0 (H) 06/21/2010 2126   O2SAT 91.0 06/21/2010 2126   CBG (last 3)  Recent Labs    12/20/18 2300  GLUCAP 110*    Assessment/Plan: S/P Seizure, new diagnosis left frontal brain tumor  Large 10 cm left lower lobe mass with central necrosis suspicious for carcinoma of the lung.  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  If patient is not transferred I will ask for consultation with interventional radiology to perform the biopsy under local anesthesia which should be straightforward with the tumor extending to the posterior left chest wall.  Eventually a PET scan would be  important in clinical staging.    LOS: 2 days    Tharon Aquas Trigt III 12/22/2018

## 2018-12-22 NOTE — Progress Notes (Signed)
Pt with d/c orders. Discharge paperwork reviewed with pt and spouse and all questions answered. IV removed. Pt escorted to car via wheelchair with all belongings.

## 2018-12-27 MED ORDER — HYDROCODONE-ACETAMINOPHEN 5-325 MG PO TABS
2.00 | ORAL_TABLET | ORAL | Status: DC
Start: ? — End: 2018-12-27

## 2018-12-27 MED ORDER — AMIODARONE HCL 200 MG PO TABS
200.00 | ORAL_TABLET | ORAL | Status: DC
Start: ? — End: 2018-12-27

## 2018-12-27 MED ORDER — LEVETIRACETAM 500 MG PO TABS
500.00 | ORAL_TABLET | ORAL | Status: DC
Start: 2018-12-27 — End: 2018-12-27

## 2018-12-27 MED ORDER — GENERIC EXTERNAL MEDICATION
Status: DC
Start: ? — End: 2018-12-27

## 2018-12-27 MED ORDER — TAMSULOSIN HCL 0.4 MG PO CAPS
.40 | ORAL_CAPSULE | ORAL | Status: DC
Start: 2018-12-28 — End: 2018-12-27

## 2018-12-27 MED ORDER — METOPROLOL TARTRATE 25 MG PO TABS
25.00 | ORAL_TABLET | ORAL | Status: DC
Start: 2018-12-27 — End: 2018-12-27

## 2018-12-27 MED ORDER — AMLODIPINE BESYLATE 5 MG PO TABS
5.00 | ORAL_TABLET | ORAL | Status: DC
Start: 2018-12-28 — End: 2018-12-27

## 2018-12-27 MED ORDER — PANTOPRAZOLE SODIUM 40 MG PO TBEC
40.00 | DELAYED_RELEASE_TABLET | ORAL | Status: DC
Start: 2018-12-28 — End: 2018-12-27

## 2018-12-27 MED ORDER — HYDRALAZINE HCL 20 MG/ML IJ SOLN
10.00 | INTRAMUSCULAR | Status: DC
Start: ? — End: 2018-12-27

## 2018-12-27 MED ORDER — ATORVASTATIN CALCIUM 40 MG PO TABS
40.00 | ORAL_TABLET | ORAL | Status: DC
Start: 2018-12-28 — End: 2018-12-27

## 2018-12-27 MED ORDER — HYDROCODONE-ACETAMINOPHEN 5-325 MG PO TABS
1.00 | ORAL_TABLET | ORAL | Status: DC
Start: ? — End: 2018-12-27

## 2018-12-27 MED ORDER — FUROSEMIDE 20 MG PO TABS
40.00 | ORAL_TABLET | ORAL | Status: DC
Start: 2018-12-30 — End: 2018-12-27

## 2018-12-27 MED ORDER — LABETALOL HCL 5 MG/ML IV SOLN
10.00 | INTRAVENOUS | Status: DC
Start: ? — End: 2018-12-27

## 2018-12-27 MED ORDER — ONDANSETRON HCL 4 MG/2ML IJ SOLN
4.00 | INTRAMUSCULAR | Status: DC
Start: ? — End: 2018-12-27

## 2018-12-27 MED ORDER — LOSARTAN POTASSIUM 50 MG PO TABS
50.00 | ORAL_TABLET | ORAL | Status: DC
Start: 2018-12-28 — End: 2018-12-27

## 2019-07-05 MED ORDER — MAGNESIUM OXIDE 400 MG PO TABS
400.00 | ORAL_TABLET | ORAL | Status: DC
Start: 2019-07-05 — End: 2019-07-05

## 2019-07-05 MED ORDER — AMOXICILLIN 500 MG PO CAPS
1000.00 | ORAL_CAPSULE | ORAL | Status: DC
Start: 2019-07-05 — End: 2019-07-05

## 2019-07-05 MED ORDER — VANCOMYCIN HCL 125 MG PO CAPS
125.00 | ORAL_CAPSULE | ORAL | Status: DC
Start: 2019-07-05 — End: 2019-07-05

## 2019-07-05 MED ORDER — GENERIC EXTERNAL MEDICATION
12.50 | Status: DC
Start: 2019-07-05 — End: 2019-07-05

## 2019-07-05 MED ORDER — FUROSEMIDE 20 MG PO TABS
40.00 | ORAL_TABLET | ORAL | Status: DC
Start: 2019-07-06 — End: 2019-07-05

## 2019-07-05 MED ORDER — PREDNISONE 5 MG PO TABS
5.00 | ORAL_TABLET | ORAL | Status: DC
Start: 2019-07-06 — End: 2019-07-05

## 2019-07-05 MED ORDER — AMIODARONE HCL 200 MG PO TABS
200.00 | ORAL_TABLET | ORAL | Status: DC
Start: 2019-07-06 — End: 2019-07-05

## 2019-07-05 MED ORDER — DIPHENHYDRAMINE HCL 25 MG PO CAPS
25.00 | ORAL_CAPSULE | ORAL | Status: DC
Start: ? — End: 2019-07-05

## 2019-07-05 MED ORDER — ACETAMINOPHEN 325 MG PO TABS
650.00 | ORAL_TABLET | ORAL | Status: DC
Start: ? — End: 2019-07-05

## 2019-07-05 MED ORDER — BACITRACIN ZINC 500 UNIT/GM EX OINT
TOPICAL_OINTMENT | CUTANEOUS | Status: DC
Start: 2019-07-05 — End: 2019-07-05

## 2019-07-05 MED ORDER — ATORVASTATIN CALCIUM 40 MG PO TABS
40.00 | ORAL_TABLET | ORAL | Status: DC
Start: 2019-07-06 — End: 2019-07-05

## 2019-07-05 MED ORDER — SENNOSIDES-DOCUSATE SODIUM 8.6-50 MG PO TABS
2.00 | ORAL_TABLET | ORAL | Status: DC
Start: 2019-07-05 — End: 2019-07-05

## 2019-07-05 MED ORDER — LORAZEPAM 1 MG PO TABS
1.00 | ORAL_TABLET | ORAL | Status: DC
Start: ? — End: 2019-07-05

## 2019-07-05 MED ORDER — POLYETHYLENE GLYCOL 3350 17 G PO PACK
17.00 | PACK | ORAL | Status: DC
Start: 2019-07-06 — End: 2019-07-05

## 2019-07-05 MED ORDER — FERROUS SULFATE 325 (65 FE) MG PO TABS
325.00 | ORAL_TABLET | ORAL | Status: DC
Start: 2019-07-06 — End: 2019-07-05

## 2019-07-05 MED ORDER — BISACODYL 10 MG RE SUPP
10.00 | RECTAL | Status: DC
Start: ? — End: 2019-07-05

## 2019-07-05 MED ORDER — ONDANSETRON 4 MG PO TBDP
4.00 | ORAL_TABLET | ORAL | Status: DC
Start: ? — End: 2019-07-05

## 2019-07-05 MED ORDER — LEVETIRACETAM 500 MG PO TABS
500.00 | ORAL_TABLET | ORAL | Status: DC
Start: 2019-07-05 — End: 2019-07-05

## 2019-07-05 MED ORDER — MELATONIN 3 MG PO TABS
3.00 | ORAL_TABLET | ORAL | Status: DC
Start: 2019-07-05 — End: 2019-07-05

## 2019-07-05 MED ORDER — CEFDINIR 300 MG PO CAPS
300.00 | ORAL_CAPSULE | ORAL | Status: DC
Start: 2019-07-05 — End: 2019-07-05

## 2019-07-05 MED ORDER — PANTOPRAZOLE SODIUM 40 MG IV SOLR
40.00 | INTRAVENOUS | Status: DC
Start: 2019-07-05 — End: 2019-07-05

## 2019-07-25 MED ORDER — LACTATED RINGERS IV SOLN
INTRAVENOUS | Status: DC
Start: ? — End: 2019-07-25

## 2019-07-25 MED ORDER — LORAZEPAM 1 MG PO TABS
1.00 | ORAL_TABLET | ORAL | Status: DC
Start: ? — End: 2019-07-25

## 2019-07-25 MED ORDER — CHLORHEXIDINE GLUCONATE 0.12 % MT SOLN
15.00 | OROMUCOSAL | Status: DC
Start: 2019-07-25 — End: 2019-07-25

## 2019-07-25 MED ORDER — CARBOXYMETHYLCELLULOSE SODIUM 0.5 % OP SOLN
1.00 | OPHTHALMIC | Status: DC
Start: ? — End: 2019-07-25

## 2019-07-25 MED ORDER — CHLORHEXIDINE GLUCONATE 0.12 % MT SOLN
15.00 | OROMUCOSAL | Status: DC
Start: ? — End: 2019-07-25

## 2019-07-25 MED ORDER — GENERIC EXTERNAL MEDICATION
20.00 | Status: DC
Start: ? — End: 2019-07-25

## 2019-07-25 MED ORDER — FAMOTIDINE 20 MG/2ML IV SOLN
20.00 | INTRAVENOUS | Status: DC
Start: 2019-07-25 — End: 2019-07-25

## 2019-07-25 MED ORDER — GENERIC EXTERNAL MEDICATION
20.00 | Status: DC
Start: 2019-07-25 — End: 2019-07-25

## 2019-07-25 MED ORDER — IPRATROPIUM-ALBUTEROL 0.5-2.5 (3) MG/3ML IN SOLN
3.00 | RESPIRATORY_TRACT | Status: DC
Start: ? — End: 2019-07-25

## 2019-07-25 MED ORDER — MELATONIN 3 MG PO TABS
3.00 | ORAL_TABLET | ORAL | Status: DC
Start: 2019-07-25 — End: 2019-07-25

## 2019-07-25 MED ORDER — GENERIC EXTERNAL MEDICATION
5.00 | Status: DC
Start: ? — End: 2019-07-25

## 2019-07-25 MED ORDER — GENERIC EXTERNAL MEDICATION
2.50 | Status: DC
Start: ? — End: 2019-07-25

## 2019-07-25 MED ORDER — ACETAMINOPHEN 325 MG PO TABS
650.00 | ORAL_TABLET | ORAL | Status: DC
Start: ? — End: 2019-07-25

## 2019-07-25 MED ORDER — GLYCOPYRROLATE 0.2 MG/ML IJ SOLN
0.20 | INTRAMUSCULAR | Status: DC
Start: ? — End: 2019-07-25

## 2019-08-03 DEATH — deceased
